# Patient Record
Sex: Female | Born: 1960 | Race: White | Hispanic: No | Marital: Married | Smoking: Never smoker
Health system: Southern US, Community
[De-identification: ages and names within clinical notes are randomized; demographics above are authoritative.]

## PROBLEM LIST (undated history)

## (undated) DIAGNOSIS — R7689 Other specified abnormal immunological findings in serum: Secondary | ICD-10-CM

## (undated) DIAGNOSIS — E7800 Pure hypercholesterolemia, unspecified: Secondary | ICD-10-CM

## (undated) DIAGNOSIS — F419 Anxiety disorder, unspecified: Secondary | ICD-10-CM

## (undated) DIAGNOSIS — M069 Rheumatoid arthritis, unspecified: Secondary | ICD-10-CM

## (undated) DIAGNOSIS — J45909 Unspecified asthma, uncomplicated: Secondary | ICD-10-CM

## (undated) DIAGNOSIS — G43909 Migraine, unspecified, not intractable, without status migrainosus: Secondary | ICD-10-CM

## (undated) DIAGNOSIS — R768 Other specified abnormal immunological findings in serum: Secondary | ICD-10-CM

## (undated) HISTORY — DX: Rheumatoid arthritis, unspecified: M06.9

## (undated) HISTORY — DX: Migraine, unspecified, not intractable, without status migrainosus: G43.909

## (undated) HISTORY — PX: ROTATOR CUFF REPAIR: SHX139

## (undated) HISTORY — PX: WISDOM TOOTH EXTRACTION: SHX21

## (undated) HISTORY — PX: ABDOMINAL HYSTERECTOMY: SHX81

## (undated) HISTORY — PX: CARPAL TUNNEL RELEASE: SHX101

## (undated) HISTORY — DX: Pure hypercholesterolemia, unspecified: E78.00

## (undated) HISTORY — DX: Anxiety disorder, unspecified: F41.9

## (undated) HISTORY — PX: DILATION AND CURETTAGE OF UTERUS: SHX78

## (undated) HISTORY — PX: HERNIA REPAIR: SHX51

---

## 2013-11-30 ENCOUNTER — Other Ambulatory Visit (HOSPITAL_COMMUNITY): Payer: Self-pay | Admitting: Obstetrics and Gynecology

## 2013-11-30 DIAGNOSIS — N838 Other noninflammatory disorders of ovary, fallopian tube and broad ligament: Secondary | ICD-10-CM

## 2013-12-01 ENCOUNTER — Ambulatory Visit (HOSPITAL_COMMUNITY)
Admission: RE | Admit: 2013-12-01 | Discharge: 2013-12-01 | Disposition: A | Discharge status: Home / Self Care | Payer: BC Managed Care – PPO | Source: Ambulatory Visit | Attending: Obstetrics and Gynecology | Admitting: Obstetrics and Gynecology

## 2013-12-04 ENCOUNTER — Ambulatory Visit (HOSPITAL_COMMUNITY)
Admission: RE | Admit: 2013-12-04 | Discharge: 2013-12-04 | Disposition: A | Discharge status: Home / Self Care | Payer: BC Managed Care – PPO | Source: Ambulatory Visit | Attending: Obstetrics and Gynecology | Admitting: Obstetrics and Gynecology

## 2013-12-04 ENCOUNTER — Encounter (HOSPITAL_COMMUNITY): Payer: Self-pay

## 2013-12-04 DIAGNOSIS — N838 Other noninflammatory disorders of ovary, fallopian tube and broad ligament: Secondary | ICD-10-CM | POA: Insufficient documentation

## 2013-12-04 HISTORY — DX: Unspecified asthma, uncomplicated: J45.909

## 2013-12-04 MED ORDER — IOHEXOL 300 MG/ML  SOLN
100.0000 mL | Freq: Once | INTRAMUSCULAR | Status: AC | PRN
  Administered 2013-12-04: 100 mL via INTRAVENOUS

## 2014-01-26 ENCOUNTER — Ambulatory Visit (INDEPENDENT_AMBULATORY_CARE_PROVIDER_SITE_OTHER): Payer: Self-pay | Admitting: General Surgery

## 2014-03-01 ENCOUNTER — Inpatient Hospital Stay (HOSPITAL_COMMUNITY)
Admission: AD | Admit: 2014-03-01 | Discharge: 2014-03-01 | Disposition: A | Discharge status: Home / Self Care | Payer: BC Managed Care – PPO | Source: Ambulatory Visit | Attending: Obstetrics and Gynecology | Admitting: Obstetrics and Gynecology

## 2014-03-01 ENCOUNTER — Encounter (HOSPITAL_COMMUNITY): Payer: Self-pay | Admitting: *Deleted

## 2014-03-01 DIAGNOSIS — N99820 Postprocedural hemorrhage and hematoma of a genitourinary system organ or structure following a genitourinary system procedure: Secondary | ICD-10-CM | POA: Diagnosis not present

## 2014-03-01 DIAGNOSIS — N939 Abnormal uterine and vaginal bleeding, unspecified: Secondary | ICD-10-CM | POA: Diagnosis present

## 2014-03-01 DIAGNOSIS — Z90710 Acquired absence of both cervix and uterus: Secondary | ICD-10-CM | POA: Insufficient documentation

## 2014-03-01 HISTORY — DX: Other specified abnormal immunological findings in serum: R76.89

## 2014-03-01 HISTORY — DX: Other specified abnormal immunological findings in serum: R76.8

## 2014-03-01 LAB — URINE MICROSCOPIC-ADD ON

## 2014-03-01 LAB — URINALYSIS, ROUTINE W REFLEX MICROSCOPIC
BILIRUBIN URINE: NEGATIVE
Glucose, UA: NEGATIVE mg/dL
Ketones, ur: NEGATIVE mg/dL
Nitrite: NEGATIVE
PH: 5.5 (ref 5.0–8.0)
Protein, ur: NEGATIVE mg/dL
SPECIFIC GRAVITY, URINE: 1.015 (ref 1.005–1.030)
UROBILINOGEN UA: 0.2 mg/dL (ref 0.0–1.0)

## 2014-03-01 NOTE — Discharge Instructions (Signed)
Hematoma °A hematoma is a collection of blood. The collection of blood can turn into a hard, painful lump under the skin. Your skin may turn blue or yellow if the hematoma is close to the surface of the skin. Most hematomas get better in a few days to weeks. Some hematomas are serious and need medical care. Hematomas can be very small or very big. °HOME CARE °· Apply ice to the injured area: °¨ Put ice in a plastic bag. °¨ Place a towel between your skin and the bag. °¨ Leave the ice on for 20 minutes, 2-3 times a day for the first 1 to 2 days. °· After the first 2 days, switch to using warm packs on the injured area. °· Raise (elevate) the injured area to lessen pain and puffiness (swelling). You may also wrap the area with an elastic bandage. Make sure the bandage is not wrapped too tight. °· If you have a painful hematoma on your leg or foot, you may use crutches for a couple days. °· Only take medicines as told by your doctor. °GET HELP RIGHT AWAY IF:  °· Your pain gets worse. °· Your pain is not controlled with medicine. °· You have a fever. °· Your puffiness gets worse. °· Your skin turns more blue or yellow. °· Your skin over the hematoma breaks or starts bleeding. °· Your hematoma is in your chest or belly (abdomen) and you are short of breath, feel weak, or have a change in consciousness. °· Your hematoma is on your scalp and you have a headache that gets worse or a change in alertness or consciousness. °MAKE SURE YOU:  °· Understand these instructions. °· Will watch your condition. °· Will get help right away if you are not doing well or get worse. °Document Released: 04/09/2004 Document Revised: 11/02/2012 Document Reviewed: 08/10/2012 °ExitCare® Patient Information ©2015 [employer]. This information is not intended to replace advice given to you by your health care provider. Make sure you discuss any questions you have with your health care provider. ° °

## 2014-03-01 NOTE — MAU Provider Note (Signed)
History     CSN: [id number]  Arrival date and time: 03/01/14 3567   First Provider Initiated Contact with Patient 03/01/14 [phone]      No chief complaint on file.  HPI  Diana Graham is a 53 y.o. G3P3 who is s/p LAVH, BSO and umbilical hernia repair on 02/20/14 at the [facility] who presents to MAU today with sudden onset vaginal bleeding at 0330. She states only a small amount of blood noted since initial gush that soaked her underwear. Patient endorses mild lower abdominal cramping. She has been taking Norco PRN for pain. She denies UTI symptoms or fever.   OB History    No data available      Past Medical History  Diagnosis Date  . Asthma   . ANA positive     Past Surgical History  Procedure Laterality Date  . Abdominal hysterectomy    . Carpal tunnel release    . Hernia repair    . Dilation and curettage of uterus    . Wisdom tooth extraction      History reviewed. No pertinent family history.  History  Substance Use Topics  . Smoking status: Never Smoker   . Smokeless tobacco: Not on file  . Alcohol Use: No    Allergies:  Allergies  Allergen Reactions  . Demerol [Meperidine]   . Morphine And Related   . Penicillins   . Sulfa Antibiotics     Prescriptions prior to admission  Medication Sig Dispense Refill Last Dose  . FLUoxetine (PROZAC) 20 MG capsule Take 20 mg by mouth daily.   02/28/2014 at Unknown time  . gabapentin (NEURONTIN) 100 MG capsule Take 100 mg by mouth 3 (three) times daily.   02/28/2014 at Unknown time  . HYDROcodone-acetaminophen (NORCO/VICODIN) 5-325 MG per tablet Take 1 tablet by mouth every 6 (six) hours as needed for moderate pain.   02/28/2014 at Unknown time  . methocarbamol (ROBAXIN) 500 MG tablet Take 500 mg by mouth every 6 (six) hours as needed for muscle spasms.   02/28/2014 at Unknown time    Review of Systems  Constitutional: Negative for fever and malaise/fatigue.  Gastrointestinal: Positive for abdominal pain.   Genitourinary: Negative for dysuria, urgency and frequency.       + vaginal bleeding   Physical Exam   Blood pressure 121/78, pulse 68, temperature 97.8 F (36.6 C), temperature source Oral, resp. rate 20, height 5\' 3"  (1.6 m), weight 143 lb (64.864 kg), SpO2 99 %.  Physical Exam  Constitutional: She is oriented to person, place, and time. She appears well-developed and well-nourished. No distress.  HENT:  Head: Normocephalic.  Cardiovascular: Normal rate.   Respiratory: Effort normal.  GI: Soft. She exhibits no distension and no mass. There is tenderness (mild). There is no rebound and no guarding.  Genitourinary: There is bleeding (scant dark brown blood noted without any active bleeding noted) in the vagina. No signs of injury (stiches intact at vaginal cuff) around the vagina. No vaginal discharge found.  Neurological: She is alert and oriented to person, place, and time.  Skin: Skin is warm and dry. No erythema.  Psychiatric: She has a normal mood and affect.   Results for orders placed or performed during the hospital encounter of 03/01/14 (from the past 24 hour(s))  Urinalysis, Routine w reflex microscopic     Status: Abnormal   Collection Time: 03/01/14  5:35 AM  Result Value Ref Range   Color, Urine YELLOW YELLOW   APPearance  CLEAR CLEAR   Specific Gravity, Urine 1.015 1.005 - 1.030   pH 5.5 5.0 - 8.0   Glucose, UA NEGATIVE NEGATIVE mg/dL   Hgb urine dipstick LARGE (A) NEGATIVE   Bilirubin Urine NEGATIVE NEGATIVE   Ketones, ur NEGATIVE NEGATIVE mg/dL   Protein, ur NEGATIVE NEGATIVE mg/dL   Urobilinogen, UA 0.2 0.0 - 1.0 mg/dL   Nitrite NEGATIVE NEGATIVE   Leukocytes, UA TRACE (A) NEGATIVE  Urine microscopic-add on     Status: None   Collection Time: 03/01/14  5:35 AM  Result Value Ref Range   Squamous Epithelial / LPF RARE RARE   WBC, UA 0-2 <3 WBC/hpf   RBC / HPF 7-10 <3 RBC/hpf   Bacteria, UA RARE RARE     MAU Course  Procedures None  MDM UA  today Discussed patient with Dr. Julien Girt. [place] for discharge with bleeding precautions. Follow-up as scheduled or sooner if symptoms worsen Assessment and Plan  A: S/P LAVH, BSO and umbilical hernia repair Vaginal bleeding  P: Discharge home Bleeding precautions discussed Patient to continue Norco PRN pain Patient advised to follow-up with Dr. Gaetano Net as scheduled for routine post operative appointment or sooner if symptoms worsen Patient may return to MAU as needed or if her condition were to change or worsen   Luvenia Redden, PA-C  03/01/2014, 6:21 AM

## 2014-03-01 NOTE — MAU Note (Signed)
Pt reports she awakened about 0330 with vaginal bleeding, states she is s/p lap total hysterectomy on 12/08. Onset of abd cramping yesterday. Pt also reports she had a umbilical hernia repair on 12/08

## 2015-02-27 ENCOUNTER — Encounter: Payer: Self-pay | Admitting: Gastroenterology

## 2015-03-17 HISTORY — PX: EPIDURAL BLOCK INJECTION: SHX1516

## 2015-03-20 ENCOUNTER — Ambulatory Visit: Payer: Self-pay | Admitting: Nurse Practitioner

## 2015-03-22 ENCOUNTER — Ambulatory Visit (INDEPENDENT_AMBULATORY_CARE_PROVIDER_SITE_OTHER): Payer: BLUE CROSS/BLUE SHIELD | Admitting: Gastroenterology

## 2015-03-22 ENCOUNTER — Encounter: Payer: Self-pay | Admitting: Gastroenterology

## 2015-03-22 ENCOUNTER — Other Ambulatory Visit: Payer: Self-pay

## 2015-03-22 VITALS — BP 119/73 | HR 73 | Temp 97.4°F | Ht 63.0 in | Wt 150.6 lb

## 2015-03-22 DIAGNOSIS — Z1211 Encounter for screening for malignant neoplasm of colon: Secondary | ICD-10-CM

## 2015-03-22 DIAGNOSIS — R1013 Epigastric pain: Secondary | ICD-10-CM | POA: Diagnosis not present

## 2015-03-22 MED ORDER — PANTOPRAZOLE SODIUM 40 MG PO TBEC: 40.0000 mg | DELAYED_RELEASE_TABLET | Freq: Every day | ORAL | Status: DC

## 2015-03-22 MED ORDER — NA SULFATE-K SULFATE-MG SULF 17.5-3.13-1.6 GM/177ML PO SOLN: 1.0000 | ORAL | Status: DC

## 2015-03-22 NOTE — Progress Notes (Signed)
Primary Care Physician:  Damien Fusi, NP Primary Gastroenterologist:  Dr. Oneida Alar   Chief Complaint  Patient presents with  . Gas  . Gastroesophageal Reflux    nausea  . set up TCS    HPI:   Diana Graham is a 55 y.o. female presenting today at the request of her PCP secondary to need for initial screening colonoscopy and concerns for GERD and dysphagia.   Abdominal bloating, belching, early satiety, worsening over past few months. Has been taking OTC Zantac BID for about a year but quit taking. Has never taken a PPI. No dysphagia. Feels like something is stuck in esophagus all the time, feels heavy. No solid food dysphagia. No weight loss, actually states weight gain. Associated nausea. Week before Christmas felt nauseated, had one episode of vomiting. Resolved on own. States she has always had issues with chronic constipation, occasional diarrhea. Takes Miralax just as needed. Doesn't feel she needs a prescriptive agent. Rare that she needs it. No prior colonoscopy. Takes Mobic and prednisone prn for RA.   Past Medical History  Diagnosis Date  . Asthma   . ANA positive   . RA (rheumatoid arthritis) ([place])   . Hypercholesterolemia   . Migraines   . Anxiety     Past Surgical History  Procedure Laterality Date  . Abdominal hysterectomy    . Carpal tunnel release    . Hernia repair    . Dilation and curettage of uterus    . Wisdom tooth extraction      Current Outpatient Prescriptions  Medication Sig Dispense Refill  . atorvastatin (LIPITOR) 20 MG tablet TAKE 1 TABLET BY ORAL ROUTE EVERY DAY  10  . FLUoxetine (PROZAC) 20 MG capsule Take 20 mg by mouth daily.    . folic acid (FOLVITE) 1 MG tablet Take 1 mg by mouth daily.  2  . gabapentin (NEURONTIN) 100 MG capsule Take 100 mg by mouth 3 (three) times daily.    . hydroxychloroquine (PLAQUENIL) 200 MG tablet Take 100 mg by mouth daily.    . methotrexate (RHEUMATREX) 2.5 MG tablet TAKE 6 TABLETS BY MOUTH WEEKLY  2  .  topiramate (TOPAMAX) 50 MG tablet TAKE 1/2 TO 1&1/2 TABLETS BY MOUTH AT BEDTIME  4  . Vitamin D, Ergocalciferol, (DRISDOL) 50000 units CAPS capsule Take 50,000 Units by mouth once a week.  2  . Na Sulfate-K Sulfate-Mg Sulf (SUPREP BOWEL PREP) SOLN Take 1 kit by mouth as directed. 1 Bottle 0  . pantoprazole (PROTONIX) 40 MG tablet Take 1 tablet (40 mg total) by mouth daily. 30 tablet 3   No current facility-administered medications for this visit.    Allergies as of 03/22/2015 - Review Complete 03/22/2015  Allergen Reaction Noted  . Codeine Nausea And Vomiting 03/22/2015  . Demerol [meperidine]  03/01/2014  . Morphine and related  12/04/2013  . Penicillins  12/04/2013  . Sulfa antibiotics  12/04/2013    Family History  Problem Relation Age of Onset  . Colon cancer Neg Hx   . Colon polyps Father     Social History   Social History  . Marital Status: Married    Spouse Name: N/A  . Number of Children: N/A  . Years of Education: N/A   Occupational History  . 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 Smoking status: Never Smoker   . Smokeless tobacco: Not on  file  . Alcohol Use: No  . Drug Use: No  . Sexual Activity: Not Currently   Other Topics Concern  . Not on file   Social History Narrative    Review of Systems: Gen: Denies any fever, chills, fatigue, weight loss, lack of appetite.  CV: Denies chest pain, heart palpitations, peripheral edema, syncope.  Resp: Denies shortness of breath at rest or with exertion. Denies wheezing or cough.  GI: see HPI  GU : chronic UTIs  MS: +RA Derm: Denies rash, itching, dry skin Psych: Denies depression, anxiety, memory loss, and confusion Heme: Denies bruising, bleeding, and enlarged lymph nodes.  Physical Exam: BP 119/73 mmHg  Pulse 73  Temp(Src) 97.4 F (36.3 C)  Ht '5\' 3"'  (1.6 m)  Wt 150 lb 9.6 oz (68.312 kg)  BMI 26.68 kg/m2 General:   Alert and oriented.  Pleasant and cooperative. Well-nourished and well-developed.  Head:  Normocephalic and atraumatic. Eyes:  Without icterus, sclera clear and conjunctiva pink.  Ears:  Normal auditory acuity. Nose:  No deformity, discharge,  or lesions. Mouth:  No deformity or lesions, oral mucosa pink.  Lungs:  Clear to auscultation bilaterally. No wheezes, rales, or rhonchi. No distress.  Heart:  S1, S2 present without murmurs appreciated.  Abdomen:  +BS, soft, non-tender and non-distended. No HSM noted. No guarding or rebound. No masses appreciated.  Rectal:  Deferred  Msk:  Symmetrical without gross deformities. Normal posture. Extremities:  Without edema. Neurologic:  Alert and  oriented x4;  grossly normal neurologically. Psych:  Alert and cooperative. Normal mood and affect.

## 2015-03-22 NOTE — Patient Instructions (Signed)
I have sent Protonix to your pharmacy to take once each morning, 30 minutes before breakfast.   We have scheduled you for a colonoscopy with possible upper endoscopy with Dr. Oneida Alar in the near future.

## 2015-03-26 NOTE — Assessment & Plan Note (Signed)
55 year old female with need for initial screening colonoscopy, without any concerning lower GI symptoms. Father with history of polyps but negative family history for colon cancer.  Proceed with colonoscopy with Dr. Oneida Alar in the near future. The risks, benefits, and alternatives have been discussed in detail with the patient. They state understanding and desire to proceed.  Phenergan 25 mg IV on call due to polypharmacy PATIENT IS ALLERGIC TO DEMEROL

## 2015-03-26 NOTE — Assessment & Plan Note (Signed)
Abdominal bloating, early satiety, belching, worsening over the past few months. No dysphagia but does note a globus sensation. Likely symptoms secondary to GERD. No true esophageal dysphagia or other concerning signs such as lack of appetite or weight loss. Will trial Protonix once daily. If no improvement by time of colonoscopy, proceed with EGD.

## 2015-03-27 NOTE — Progress Notes (Signed)
CC'ED TO PCP 

## 2015-04-12 ENCOUNTER — Ambulatory Visit (HOSPITAL_COMMUNITY)
Admission: RE | Admit: 2015-04-12 | Discharge: 2015-04-12 | Disposition: A | Discharge status: Home / Self Care | Payer: BLUE CROSS/BLUE SHIELD | Source: Ambulatory Visit | Attending: Gastroenterology | Admitting: Gastroenterology

## 2015-04-12 ENCOUNTER — Encounter (HOSPITAL_COMMUNITY): Payer: Self-pay | Admitting: *Deleted

## 2015-04-12 ENCOUNTER — Encounter (HOSPITAL_COMMUNITY)
Admission: RE | Disposition: A | Discharge status: Home / Self Care | Payer: Self-pay | Source: Ambulatory Visit | Attending: Gastroenterology

## 2015-04-12 DIAGNOSIS — K222 Esophageal obstruction: Secondary | ICD-10-CM | POA: Insufficient documentation

## 2015-04-12 DIAGNOSIS — D128 Benign neoplasm of rectum: Secondary | ICD-10-CM | POA: Diagnosis not present

## 2015-04-12 DIAGNOSIS — K648 Other hemorrhoids: Secondary | ICD-10-CM | POA: Insufficient documentation

## 2015-04-12 DIAGNOSIS — E7800 Pure hypercholesterolemia, unspecified: Secondary | ICD-10-CM | POA: Insufficient documentation

## 2015-04-12 DIAGNOSIS — Z79899 Other long term (current) drug therapy: Secondary | ICD-10-CM | POA: Diagnosis not present

## 2015-04-12 DIAGNOSIS — K5790 Diverticulosis of intestine, part unspecified, without perforation or abscess without bleeding: Secondary | ICD-10-CM | POA: Diagnosis not present

## 2015-04-12 DIAGNOSIS — Q438 Other specified congenital malformations of intestine: Secondary | ICD-10-CM | POA: Insufficient documentation

## 2015-04-12 DIAGNOSIS — Z1211 Encounter for screening for malignant neoplasm of colon: Secondary | ICD-10-CM | POA: Insufficient documentation

## 2015-04-12 DIAGNOSIS — F419 Anxiety disorder, unspecified: Secondary | ICD-10-CM | POA: Insufficient documentation

## 2015-04-12 DIAGNOSIS — Q394 Esophageal web: Secondary | ICD-10-CM

## 2015-04-12 DIAGNOSIS — R1310 Dysphagia, unspecified: Secondary | ICD-10-CM | POA: Diagnosis not present

## 2015-04-12 DIAGNOSIS — R1013 Epigastric pain: Secondary | ICD-10-CM | POA: Diagnosis not present

## 2015-04-12 DIAGNOSIS — D125 Benign neoplasm of sigmoid colon: Secondary | ICD-10-CM | POA: Insufficient documentation

## 2015-04-12 DIAGNOSIS — K2970 Gastritis, unspecified, without bleeding: Secondary | ICD-10-CM | POA: Insufficient documentation

## 2015-04-12 HISTORY — PX: ESOPHAGEAL DILATION: SHX303

## 2015-04-12 HISTORY — PX: ESOPHAGOGASTRODUODENOSCOPY: SHX5428

## 2015-04-12 HISTORY — PX: COLONOSCOPY: SHX5424

## 2015-04-12 SURGERY — COLONOSCOPY
Anesthesia: Moderate Sedation

## 2015-04-12 MED ORDER — LIDOCAINE VISCOUS 2 % MT SOLN
OROMUCOSAL | Status: DC | PRN
  Administered 2015-04-12: 4 mL via OROMUCOSAL

## 2015-04-12 MED ORDER — MIDAZOLAM HCL 5 MG/5ML IJ SOLN
INTRAMUSCULAR | Status: AC
  Filled 2015-04-12: qty 10

## 2015-04-12 MED ORDER — MIDAZOLAM HCL 5 MG/5ML IJ SOLN
INTRAMUSCULAR | Status: DC | PRN
Start: 2015-04-12 — End: 2015-04-12
  Administered 2015-04-12: 1 mg via INTRAVENOUS
  Administered 2015-04-12: 2 mg via INTRAVENOUS
  Administered 2015-04-12 (×2): 1 mg via INTRAVENOUS
  Administered 2015-04-12: 2 mg via INTRAVENOUS
  Administered 2015-04-12: 1 mg via INTRAVENOUS

## 2015-04-12 MED ORDER — PROMETHAZINE HCL 25 MG/ML IJ SOLN
25.0000 mg | Freq: Once | INTRAMUSCULAR | Status: AC
  Administered 2015-04-12: 25 mg via INTRAVENOUS

## 2015-04-12 MED ORDER — SODIUM CHLORIDE 0.9 % IV SOLN
INTRAVENOUS | Status: DC
  Administered 2015-04-12: 1000 mL via INTRAVENOUS

## 2015-04-12 MED ORDER — FENTANYL CITRATE (PF) 100 MCG/2ML IJ SOLN
INTRAMUSCULAR | Status: DC
Start: 2015-04-12 — End: 2015-04-12
  Filled 2015-04-12: qty 4

## 2015-04-12 MED ORDER — PROMETHAZINE HCL 25 MG/ML IJ SOLN
INTRAMUSCULAR | Status: AC
  Filled 2015-04-12: qty 1

## 2015-04-12 MED ORDER — FENTANYL CITRATE (PF) 100 MCG/2ML IJ SOLN
INTRAMUSCULAR | Status: DC | PRN
  Administered 2015-04-12 (×3): 25 ug via INTRAVENOUS
  Administered 2015-04-12: 50 ug via INTRAVENOUS
  Administered 2015-04-12: 25 ug via INTRAVENOUS

## 2015-04-12 MED ORDER — SIMETHICONE 40 MG/0.6ML PO SUSP
ORAL | Status: DC | PRN
  Administered 2015-04-12: 14:00:00

## 2015-04-12 MED ORDER — MEPERIDINE HCL 100 MG/ML IJ SOLN
INTRAMUSCULAR | Status: AC
  Filled 2015-04-12: qty 2

## 2015-04-12 MED ORDER — MINERAL OIL PO OIL
TOPICAL_OIL | ORAL | Status: AC
  Filled 2015-04-12: qty 30

## 2015-04-12 MED ORDER — LIDOCAINE VISCOUS 2 % MT SOLN
OROMUCOSAL | Status: AC
  Filled 2015-04-12: qty 15

## 2015-04-12 MED ORDER — SODIUM CHLORIDE 0.9% FLUSH
INTRAVENOUS | Status: AC
  Filled 2015-04-12: qty 10

## 2015-04-12 NOTE — Progress Notes (Signed)
REVIEWED-NO ADDITIONAL RECOMMENDATIONS. 

## 2015-04-12 NOTE — Op Note (Signed)
[facility] [address] [place], [address]   COLONOSCOPY PROCEDURE REPORT  PATIENT: Diana Graham, Diana Graham  MR#: [id number] BIRTHDATE: [birth year] , 60  yrs. old GENDER: female ENDOSCOPIST: Danie Binder, MD REFERRED VS:[id number] [place], [place]:  04/21/15 PROCEDURE:   Colonoscopy with snare polypectomy INDICATIONS:average risk patient for colon cancer. MEDICATIONS: Fentanyl 125 mcg IV, Versed 6 mg IV, and Promethazine (Phenergan) 25 mg IV  DESCRIPTION OF PROCEDURE:    Physical exam was performed.  Informed consent was obtained from the patient after explaining the benefits, risks, and alternatives to procedure.  The patient was connected to monitor and placed in left lateral position. Continuous oxygen was provided by nasal cannula and IV medicine administered through an indwelling cannula.  After administration of sedation and rectal exam, the patients rectum was intubated and the [id number]  colonoscope was advanced under direct visualization to the cecum.  The scope was removed slowly by carefully examining the color, texture, anatomy, and integrity mucosa on the way out.  The patient was recovered in endoscopy and discharged home in satisfactory condition. Estimated blood loss is zero unless otherwise noted in this procedure report.    COLON FINDINGS: The colon was redundant.  Manual abdominal counter-pressure was used to reach the cecum, Two polyps ranging from 6 to 45mm in size were found in the rectum and sigmoid colon(1.2 CM).  A polypectomy was performed using snare cautery. Bleeding at the site was controlled using hemoclips.  One (1) placement was made IN THE SIGMOID COLON.  There was minimal blood loss from maneuver subsiding by end of procedure, There was moderate diverticulosis noted throughout the entire examined colon with associated muscular hypertrophy, tortuosity and angulation.  , and Small internal hemorrhoids were  found.  PREP QUALITY: good.  CECAL W/D TIME: 20       minutes COMPLICATIONS: None  ENDOSCOPIC IMPRESSION: 1.   The LEFT colon IS redundant 2.   Two COLON polyps REMOVED r 3.   Moderate diverticulosis throughout the entire examined colon 4.   Small internal hemorrhoids  RECOMMENDATIONS: NO MRI FOR 30 DAYS. DRINK WATER TO KEEP URINE LIGHT YELLOW. AVOID ASPIRIN, IBUPROFEN, MOTRIN, ALEVE, BC/GOODY POWDERS FOR 2 WEEKS.  USE TYLENOL FOR PAIN. FOLLOW A HIGH FIBER/LOW FAT DIET. AVOID THINGS THAT TRIGGERS REFLUX. AWAIT BIOPSY RESULTS. FOLLOW UP IN 3 MOS. Next colonoscopy in 1-3 years WITH [facility].  ALL SISTERS, BROTHERS, CHILDREN, AND PARENTS NEED TO HAVE A COLONOSCOPY STARTING AT THE AGE OF 40.  eSigned:  Danie Binder, MD 04-21-15 4:07 PM  CPT CODES: ICD CODES:  The ICD and CPT codes recommended by this software are interpretations from the data that the clinical staff has captured with the software.  The verification of the translation of this report to the ICD and CPT codes and modifiers is the sole responsibility of the health care institution and practicing physician where this report was generated.  [employer] will not be held responsible for the validity of the ICD and CPT codes included on this report.  AMA assumes no liability for data contained or not contained herein. CPT is a Designer, television/film set of the [employer].

## 2015-04-12 NOTE — Discharge Instructions (Signed)
YOUR DIFFICULTY SWALLOWING/GLOBUS SENSATION IS MOST LIKELY DUE TO UNCONTROLLED GERD. I dilated your esophagus DUE TO A RING AT THE BASE. You have MILD gastritis. I biopsied your ESOPHAGUS AND stomach. You had 2 polyps removed. I PLACED A CLIP TO PREVENT BLEEDING IN 7-10 DAYS. You have internal hemorrhoids and diverticulosis IN YOUR ENTIRE COLON.   DRINK WATER TO KEEP YOUR URINE LIGHT YELLOW.  AVOID ASPIRIN, IBUPROFEN, MOTRIN, ALEVE, BC/GOODY POWDERS FOR 2 WEEKS. USE TYLENOL FOR PAIN.  FOLLOW A HIGH FIBER/LOW FAT DIET. AVOID ITEMS THAT CAUSE BLOATING. SEE INFO BELOW.  AVOID THINGS THAT TRIGGERS REFLUX. SEE INFO BELOW.   YOUR BIOPSY RESULTS WILL BE AVAILABLE IN MY CHART JAN 31 AND MY OFFICE WILL CONTACT YOU IN 10-14 DAYS WITH YOUR RESULTS.   FOLLOW UP IN 3 MOS.  Next colonoscopy in 1-3 years. YOUR SISTERS, BROTHERS, CHILDREN, AND PARENTS NEED TO HAVE A COLONOSCOPY STARTING AT THE AGE OF 40.    ENDOSCOPY Care After Read the instructions outlined below and refer to this sheet in the next week. These discharge instructions provide you with general information on caring for yourself after you leave the hospital. While your treatment has been planned according to the most current medical practices available, unavoidable complications occasionally occur. If you have any problems or questions after discharge, call DR. Lajuanna Pompa, [phone].  ACTIVITY  You may resume your regular activity, but move at a slower pace for the next 24 hours.   Take frequent rest periods for the next 24 hours.   Walking will help get rid of the air and reduce the bloated feeling in your belly (abdomen).   No driving for 24 hours (because of the medicine (anesthesia) used during the test).   You may shower.   Do not sign any important legal documents or operate any machinery for 24 hours (because of the anesthesia used during the test).    NUTRITION  Drink plenty of fluids.   You may resume your normal diet  as instructed by your doctor.   Begin with a light meal and progress to your normal diet. Heavy or fried foods are harder to digest and may make you feel sick to your stomach (nauseated).   Avoid alcoholic beverages for 24 hours or as instructed.    MEDICATIONS  You may resume your normal medications.   WHAT YOU CAN EXPECT TODAY  Some feelings of bloating in the abdomen.   Passage of more gas than usual.   Spotting of blood in your stool or on the toilet paper  .  IF YOU HAD POLYPS REMOVED DURING THE ENDOSCOPY:  Eat a soft diet IF YOU HAVE NAUSEA, BLOATING, ABDOMINAL PAIN, OR VOMITING.    FINDING OUT THE RESULTS OF YOUR TEST Not all test results are available during your visit. DR. Oneida Alar WILL CALL YOU WITHIN 14 DAYS OF YOUR PROCEDUE WITH YOUR RESULTS. Do not assume everything is normal if you have not heard from DR. Samuele Storey, CALL HER OFFICE AT [phone].  SEEK IMMEDIATE MEDICAL ATTENTION AND CALL THE OFFICE: [phone] IF:  You have more than a spotting of blood in your stool.   Your belly is swollen (abdominal distention).   You are nauseated or vomiting.   You have a temperature over 101F.   You have abdominal pain or discomfort that is severe or gets worse throughout the day. Gastritis  Gastritis is an inflammation (the body's way of reacting to injury and/or infection) of the stomach. It is often caused by viral  or bacterial (germ) infections. It can also be caused BY ASPIRIN, BC/GOODY POWDER'S, (IBUPROFEN) MOTRIN, OR ALEVE (NAPROXEN), chemicals (including alcohol), SPICY FOODS, and medications. This illness may be associated with generalized malaise (feeling tired, not well), UPPER ABDOMINAL STOMACH cramps, and fever. One common bacterial cause of gastritis is an organism known as H. Pylori. This can be treated with antibiotics.   High-Fiber Diet A high-fiber diet changes your normal diet to include more whole grains, legumes, fruits, and vegetables. Changes in  the diet involve replacing refined carbohydrates with unrefined foods. The calorie level of the diet is essentially unchanged. The Dietary Reference Intake (recommended amount) for adult males is 38 grams per day. For adult females, it is 25 grams per day. Pregnant and lactating women should consume 28 grams of fiber per day. Fiber is the intact part of a plant that is not broken down during digestion. Functional fiber is fiber that has been isolated from the plant to provide a beneficial effect in the body. PURPOSE  Increase stool bulk.   Ease and regulate bowel movements.   Lower cholesterol.  REDUCE RISK OF COLON CANCER  INDICATIONS THAT YOU NEED MORE FIBER  Constipation and hemorrhoids.   Uncomplicated diverticulosis (intestine condition) and irritable bowel syndrome.   Weight management.   As a protective measure against hardening of the arteries (atherosclerosis), diabetes, and cancer.   GUIDELINES FOR INCREASING FIBER IN THE DIET  Start adding fiber to the diet slowly. A gradual increase of about 5 more grams (2 slices of whole-wheat bread, 2 servings of most fruits or vegetables, or 1 bowl of high-fiber cereal) per day is best. Too rapid an increase in fiber may result in constipation, flatulence, and bloating.   Drink enough water and fluids to keep your urine clear or pale yellow. Water, juice, or caffeine-free drinks are recommended. Not drinking enough fluid may cause constipation.   Eat a variety of high-fiber foods rather than one type of fiber.   Try to increase your intake of fiber through using high-fiber foods rather than fiber pills or supplements that contain small amounts of fiber.   The goal is to change the types of food eaten. Do not supplement your present diet with high-fiber foods, but replace foods in your present diet.   INCLUDE A VARIETY OF FIBER SOURCES  Replace refined and processed grains with whole grains, canned fruits with fresh fruits, and  incorporate other fiber sources. White rice, white breads, and most bakery goods contain little or no fiber.   Brown whole-grain rice, buckwheat oats, and many fruits and vegetables are all good sources of fiber. These include: broccoli, Brussels sprouts, cabbage, cauliflower, beets, sweet potatoes, white potatoes (skin on), carrots, tomatoes, eggplant, squash, berries, fresh fruits, and dried fruits.   Cereals appear to be the richest source of fiber. Cereal fiber is found in whole grains and bran. Bran is the fiber-rich outer coat of cereal grain, which is largely removed in refining. In whole-grain cereals, the bran remains. In breakfast cereals, the largest amount of fiber is found in those with "bran" in their names. The fiber content is sometimes indicated on the label.   You may need to include additional fruits and vegetables each day.   In baking, for 1 cup white flour, you may use the following substitutions:   1 cup whole-wheat flour minus 2 tablespoons.   1/2 cup white flour plus 1/2 cup whole-wheat flour.    Low-Fat Diet BREADS, CEREALS, PASTA, RICE, DRIED PEAS, AND  BEANS These products are high in carbohydrates and most are low in fat. Therefore, they can be increased in the diet as substitutes for fatty foods. They too, however, contain calories and should not be eaten in excess. Cereals can be eaten for snacks as well as for breakfast.  Include foods that contain fiber (fruits, vegetables, whole grains, and legumes). Research shows that fiber may lower blood cholesterol levels, especially the water-soluble fiber found in fruits, vegetables, oat products, and legumes. FRUITS AND VEGETABLES It is good to eat fruits and vegetables. Besides being sources of fiber, both are rich in vitamins and some minerals. They help you get the daily allowances of these nutrients. Fruits and vegetables can be used for snacks and desserts. MEATS Limit lean meat, chicken, [place], and fish to no more  than 6 ounces per day. Beef, Pork, and Lamb Use lean cuts of beef, pork, and lamb. Lean cuts include:  Extra-lean ground beef.  Arm roast.  Sirloin tip.  Center-cut ham.  Round steak.  Loin chops.  Rump roast.  Tenderloin.  Trim all fat off the outside of meats before cooking. It is not necessary to severely decrease the intake of red meat, but lean choices should be made. Lean meat is rich in protein and contains a highly absorbable form of iron. Premenopausal women, in particular, should avoid reducing lean red meat because this could increase the risk for low red blood cells (iron-deficiency anemia).  Chicken and [place] These are good sources of protein. The fat of poultry can be reduced by removing the skin and underlying fat layers before cooking. Chicken and [place] can be substituted for lean red meat in the diet. Poultry should not be fried or covered with high-fat sauces. Fish and Shellfish Fish is a good source of protein. Shellfish contain cholesterol, but they usually are low in saturated fatty acids. The preparation of fish is important. Like chicken and [place], they should not be fried or covered with high-fat sauces. EGGS Egg whites contain no fat or cholesterol. They can be eaten often. Try 1 to 2 egg whites instead of whole eggs in recipes or use egg substitutes that do not contain yolk.  MILK AND DAIRY PRODUCTS Use skim or 1% milk instead of 2% or whole milk. Decrease whole milk, natural, and processed cheeses. Use nonfat or low-fat (2%) cottage cheese or low-fat cheeses made from vegetable oils. Choose nonfat or low-fat (1 to 2%) yogurt. Experiment with evaporated skim milk in recipes that call for heavy cream. Substitute low-fat yogurt or low-fat cottage cheese for sour cream in dips and salad dressings. Have at least 2 servings of low-fat dairy products, such as 2 glasses of skim (or 1%) milk each day to help get your daily calcium intake.  FATS AND OILS Butterfat, lard,  and beef fats are high in saturated fat and cholesterol. These should be avoided.Vegetable fats do not contain cholesterol. AVOID coconut oil, palm oil, and palm kernel oil, WHICH are very high in saturated fats. These should be limited. These fats are often used in bakery goods, processed foods, popcorn, oils, and nondairy creamers. Vegetable shortenings and some peanut butters contain hydrogenated oils, which are also saturated fats. Read the labels on these foods and check for saturated vegetable oils.  Desirable liquid vegetable oils are corn oil, cottonseed oil, olive oil, canola oil, safflower oil, soybean oil, and sunflower oil. Peanut oil is not as good, but small amounts are acceptable. Buy a heart-healthy tub margarine that has no  partially hydrogenated oils in the ingredients. AVOID Mayonnaise and salad dressings often are made from unsaturated fats.  OTHER EATING TIPS Snacks  Most sweets should be limited as snacks. They tend to be rich in calories and fats, and their caloric content outweighs their nutritional value. Some good choices in snacks are graham crackers, melba toast, soda crackers, bagels (no egg), English muffins, fruits, and vegetables. These snacks are preferable to snack crackers, [place] fries, and chips. Popcorn should be air-popped or cooked in small amounts of liquid vegetable oil.  Desserts Eat fruit, low-fat yogurt, and fruit ices instead of pastries, cake, and cookies. Sherbet, angel food cake, gelatin dessert, frozen low-fat yogurt, or other frozen products that do not contain saturated fat (pure fruit juice bars, frozen ice pops) are also acceptable.   COOKING METHODS Choose those methods that use little or no fat. They include: Poaching.  Braising.  Steaming.  Grilling.  Baking.  Stir-frying.  Broiling.  Microwaving.  Foods can be cooked in a nonstick pan without added fat, or use a nonfat cooking spray in regular cookware. Limit fried foods and avoid frying  in saturated fat. Add moisture to lean meats by using water, broth, cooking wines, and other nonfat or low-fat sauces along with the cooking methods mentioned above. Soups and stews should be chilled after cooking. The fat that forms on top after a few hours in the refrigerator should be skimmed off. When preparing meals, avoid using excess salt. Salt can contribute to raising blood pressure in some people.  EATING AWAY FROM HOME Order entres, potatoes, and vegetables without sauces or butter. When meat exceeds the size of a deck of cards (3 to 4 ounces), the rest can be taken home for another meal. Choose vegetable or fruit salads and ask for low-calorie salad dressings to be served on the side. Use dressings sparingly. Limit high-fat toppings, such as bacon, crumbled eggs, cheese, sunflower seeds, and olives. Ask for heart-healthy tub margarine instead of butter.   Polyps, Colon  A polyp is extra tissue that grows inside your body. Colon polyps grow in the large intestine. The large intestine, also called the colon, is part of your digestive system. It is a long, hollow tube at the end of your digestive tract where your body makes and stores stool. Most polyps are not dangerous. They are benign. This means they are not cancerous. But over time, some types of polyps can turn into cancer. Polyps that are smaller than a pea are usually not harmful. But larger polyps could someday become or may already be cancerous. To be safe, doctors remove all polyps and test them.   PREVENTION There is not one sure way to prevent polyps. You might be able to lower your risk of getting them if you:  Eat more fruits and vegetables and less fatty food.   Do not smoke.   Avoid alcohol.   Exercise every day.   Lose weight if you are overweight.   Eating more calcium and folate can also lower your risk of getting polyps. Some foods that are rich in calcium are milk, cheese, and broccoli. Some foods that are  rich in folate are chickpeas, kidney beans, and spinach.   Diverticulosis Diverticulosis is a common condition that develops when small pouches (diverticula) form in the wall of the colon. The risk of diverticulosis increases with age. It happens more often in people who eat a low-fiber diet. Most individuals with diverticulosis have no symptoms. Those individuals with symptoms  usually experience belly (abdominal) pain, constipation, or loose stools (diarrhea).  HOME CARE INSTRUCTIONS  Increase the amount of fiber in your diet as directed by your caregiver or dietician. This may reduce symptoms of diverticulosis.   Drink at least 6 to 8 glasses of water each day to prevent constipation.   Try not to strain when you have a bowel movement.   Avoiding nuts and seeds to prevent complications is NOT NECESSARY.   FOODS HAVING HIGH FIBER CONTENT INCLUDE:  Fruits. Apple, peach, pear, tangerine, raisins, prunes.   Vegetables. Brussels sprouts, asparagus, broccoli, cabbage, carrot, cauliflower, romaine lettuce, spinach, summer squash, tomato, winter squash, zucchini.   Starchy Vegetables. Baked beans, kidney beans, lima beans, split peas, lentils, potatoes (with skin).   Grains. Whole wheat bread, brown rice, bran flake cereal, plain oatmeal, white rice, shredded wheat, bran muffins.   SEEK IMMEDIATE MEDICAL CARE IF:  You develop increasing pain or severe bloating.   You have an oral temperature above 101F.   You develop vomiting or bowel movements that are bloody or black.   Hemorrhoids Hemorrhoids are dilated (enlarged) veins around the rectum. Sometimes clots will form in the veins. This makes them swollen and painful. These are called thrombosed hemorrhoids. Causes of hemorrhoids include:  Constipation.   Straining to have a bowel movement.   HEAVY LIFTING  HOME CARE INSTRUCTIONS  Eat a well balanced diet and drink 6 to 8 glasses of water every day to avoid constipation. You  may also use a bulk laxative.   Avoid straining to have bowel movements.   Keep anal area dry and clean.   Do not use a donut shaped pillow or sit on the toilet for long periods. This increases blood pooling and pain.   Move your bowels when your body has the urge; this will require less straining and will decrease pain and pressure.

## 2015-04-12 NOTE — Op Note (Signed)
[facility] [address] [place], [address]   ENDOSCOPY PROCEDURE REPORT  PATIENT: Diana Graham, Diana Graham  MR#: [id number] BIRTHDATE: [birth year] , 51  yrs. old GENDER: female  ENDOSCOPIST: Danie Binder, MD REFFERED [id number] [place], [place]:  2015-04-30 PROCEDURE:   EGD with biopsy and EGD with dilatation over guidewire   INDICATIONS:1.  dysphagia.   2.  dyspepsia. MEDICATIONS: TCS+ Versed 4 mg IV and Fentanyl 25 mcg IV TOPICAL ANESTHETIC: Viscous Xylocaine  DESCRIPTION OF PROCEDURE:   After the risks benefits and alternatives of the procedure were thoroughly explained, informed consent was obtained.  The [id number]  endoscope was introduced through the mouth and advanced to the second portion of the duodenum. The instrument was slowly withdrawn as the mucosa was carefully examined.  Prior to withdrawal of the scope, the guidwire was placed.  The esophagus was dilated successfully.  The patient was recovered in endoscopy and discharged home in satisfactory condition. Estimated blood loss is zero unless otherwise noted in this procedure report.   ESOPHAGUS: A Schatzki ring was found at the gastroesophageal junction and was widely open.  Multiple biopsies were taken in the distal and mid esophagus to rule out eosinophilic esophagitis. STOMACH: Mild non-erosive gastritis (inflammation) was found in the gastric antrum.  Multiple biopsies were performed.   DUODENUM: The duodenal mucosa showed no abnormalities in the bulb and second portion of the duodenum.   Dilation was then performed at the gastroesphageal junction Dilator: Savary over guidewire Size(s): 16-17 MM Resistance: minimal Heme: yes TRACE  COMPLICATIONS: .  PT AGITATED FOR ENDOSCOPY IN SPITE OF ADEQUATE SEDATION.  NO RECALL IN POSTOP.  ENDOSCOPIC IMPRESSION: 1.  Schatzki ring at the gastroesophageal junction 2.  MILD Non-erosive gastritis 3.  DYSPEPSIA/DYSPHAGIA MOST LIKELY DUE TO  GERD, LESS LIKELY EOE OR ESOPHAGEAL MOTILITY DISOREDER  RECOMMENDATIONS: NO MRI FOR 30 DAYS. DRINK WATER TO KEEP URINE LIGHT YELLOW. AVOID ASPIRIN, IBUPROFEN, MOTRIN, ALEVE, BC/GOODY POWDERS FOR 2 WEEKS.  USE TYLENOL FOR PAIN. FOLLOW A HIGH FIBER/LOW FAT DIET. AVOID THINGS THAT TRIGGERS REFLUX. AWAIT BIOPSY RESULTS. FOLLOW UP IN 3 MOS. Next colonoscopy in 1-3 years WITH MAC.  ALL SISTERS, BROTHERS, CHILDREN, AND PARENTS NEED TO HAVE A COLONOSCOPY STARTING AT THE AGE OF 40. NEXT EGD WITH MAC  eSigned:  Danie Binder, MD 2015/04/30 4:14 PM  CPT CODES: ICD CODES:  The ICD and CPT codes recommended by this software are interpretations from the data that the clinical staff has captured with the software.  The verification of the translation of this report to the ICD and CPT codes and modifiers is the sole responsibility of the health care institution and practicing physician where this report was generated.  [employer] will not be held responsible for the validity of the ICD and CPT codes included on this report.  AMA assumes no liability for data contained or not contained herein. CPT is a Designer, television/film set of the [employer].

## 2015-04-12 NOTE — H&P (Signed)
Primary Care Physician:  Damien Fusi, NP Primary Gastroenterologist:  Dr. Oneida Alar  Pre-Procedure History & Physical: HPI:  Diana Graham is a 55 y.o. female here for DYSPHAGIA/screening.  Past Medical History  Diagnosis Date  . Asthma   . ANA positive   . RA (rheumatoid arthritis) ([place])   . Hypercholesterolemia   . Migraines   . Anxiety     Past Surgical History  Procedure Laterality Date  . Abdominal hysterectomy    . Carpal tunnel release    . Hernia repair    . Dilation and curettage of uterus    . Wisdom tooth extraction      Prior to Admission medications   Medication Sig Start Date End Date Taking? Authorizing Provider  atorvastatin (LIPITOR) 20 MG tablet TAKE 1 TABLET BY ORAL ROUTE EVERY DAY 03/12/15  Yes Historical Provider, MD  FLUoxetine (PROZAC) 20 MG capsule Take 20 mg by mouth daily.   Yes Historical Provider, MD  folic acid (FOLVITE) 1 MG tablet Take 1 mg by mouth daily. 03/02/15  Yes Historical Provider, MD  gabapentin (NEURONTIN) 100 MG capsule Take 100 mg by mouth at bedtime.    Yes Historical Provider, MD  hydroxychloroquine (PLAQUENIL) 200 MG tablet Take 100 mg by mouth daily.   Yes Historical Provider, MD  methotrexate (RHEUMATREX) 2.5 MG tablet TAKE 6 TABLETS BY MOUTH WEEKLY ON SATURDAYS. 03/14/15  Yes Historical Provider, MD  Na Sulfate-K Sulfate-Mg Sulf (SUPREP BOWEL PREP) SOLN Take 1 kit by mouth as directed. 03/22/15  Yes Danie Binder, MD  pantoprazole (PROTONIX) 40 MG tablet Take 1 tablet (40 mg total) by mouth daily. 03/22/15  Yes Orvil Feil, NP  topiramate (TOPAMAX) 50 MG tablet Take 1 tablet by mouth at bedtime. 03/09/15  Yes Historical Provider, MD  Vitamin D, Ergocalciferol, (DRISDOL) 50000 units CAPS capsule Take 50,000 Units by mouth once a week. Takes on Saturdays. 03/02/15  Yes Historical Provider, MD    Allergies as of 03/22/2015 - Review Complete 03/22/2015  Allergen Reaction Noted  . Codeine Nausea And Vomiting 03/22/2015  . Demerol  [meperidine]  03/01/2014  . Morphine and related  12/04/2013  . Penicillins  12/04/2013  . Sulfa antibiotics  12/04/2013    Family History  Problem Relation Age of Onset  . Colon cancer Neg Hx   . Colon polyps Father   . Arthritis/Rheumatoid Father   . Atrial fibrillation Father   . Arthritis/Rheumatoid Mother     Social History   Social History  . Marital Status: Married    Spouse Name: N/A  . Number of Children: N/A  . Years of Education: N/A   Occupational History  . [employer] in Cardiac Cath lab    Social History Main Topics  . Smoking status: Never Smoker   . Smokeless tobacco: Not on file  . Alcohol Use: No  . Drug Use: No  . Sexual Activity: Not Currently   Other Topics Concern  . Not on file   Social History Narrative    Review of Systems: See HPI, otherwise negative ROS   Physical Exam: BP 114/73 mmHg  Pulse 63  Temp(Src) 98 F (36.7 C) (Oral)  Resp 15  Ht _0  (1.6 m)  Wt 149 lb (67.586 kg)  BMI 26.40 kg/m2  SpO2 99% General:   Alert,  pleasant and cooperative in NAD Head:  Normocephalic and atraumatic. Neck:  Supple; Lungs:  Clear throughout to auscultation.  Heart:  Regular rate and rhythm. Abdomen:  Soft, nontender and nondistended. Normal bowel sounds, without guarding, and without rebound.   Neurologic:  Alert and  oriented x4;  grossly normal neurologically.  Impression/Plan:     DYSPHAGIA/screening  PLAN:  EGD/possible DIL/tcs TODAY

## 2015-04-15 ENCOUNTER — Encounter: Payer: Self-pay | Admitting: Gastroenterology

## 2015-04-17 ENCOUNTER — Encounter (HOSPITAL_COMMUNITY): Payer: Self-pay | Admitting: Gastroenterology

## 2015-04-29 ENCOUNTER — Telehealth: Payer: Self-pay | Admitting: Gastroenterology

## 2015-04-29 NOTE — Telephone Encounter (Signed)
Please call pt. She had TWO simple adenomas removed. SHE DES NOT HAVE H PYLORI GASTRITIS. HER ESOPHAGEAL BIOPSIES ARE NORMAL.   DRINK WATER TO KEEP YOUR URINE LIGHT YELLOW.  AVOID ASPIRIN, IBUPROFEN, MOTRIN, ALEVE, BC/GOODY POWDERS FOR 2 WEEKS. USE TYLENOL FOR PAIN.  FOLLOW A HIGH FIBER/LOW FAT DIET. AVOID ITEMS THAT CAUSE BLOATING. SEE INFO BELOW.  AVOID THINGS THAT TRIGGERS REFLUX.    FOLLOW UP IN 3 MOS E30 DYSPHAGIA/EARLY SATIETY.  Next colonoscopy in 3 years. YOUR SISTERS, BROTHERS, CHILDREN, AND PARENTS NEED TO HAVE A COLONOSCOPY STARTING AT THE AGE OF 40.

## 2015-04-30 NOTE — Telephone Encounter (Signed)
Pt is aware of results. 

## 2015-04-30 NOTE — Telephone Encounter (Signed)
Reminder in epic °

## 2015-07-16 ENCOUNTER — Encounter: Payer: Self-pay | Admitting: Gastroenterology

## 2015-07-16 ENCOUNTER — Ambulatory Visit (INDEPENDENT_AMBULATORY_CARE_PROVIDER_SITE_OTHER): Payer: BLUE CROSS/BLUE SHIELD | Admitting: Gastroenterology

## 2015-07-16 VITALS — BP 126/75 | HR 98 | Temp 97.8°F | Ht 63.0 in | Wt 142.2 lb

## 2015-07-16 DIAGNOSIS — K219 Gastro-esophageal reflux disease without esophagitis: Secondary | ICD-10-CM | POA: Insufficient documentation

## 2015-07-16 DIAGNOSIS — K5900 Constipation, unspecified: Secondary | ICD-10-CM | POA: Diagnosis not present

## 2015-07-16 NOTE — Assessment & Plan Note (Signed)
Dysphagia and globus sensation resolved, s/p Schatzki's ring dilation. Doing quite well on Protonix once daily. Continue and return in 6 months.

## 2015-07-16 NOTE — Progress Notes (Signed)
Primary Care Physician:  Damien Fusi, NP  Primary GI: Dr. Oneida Alar   Chief Complaint  Patient presents with  . Follow-up    HPI:   Diana Graham is a 55 y.o. female presenting today with a history of GERD and dysphagia. EGD completed with Schatzki's ring s/p dilation, non-erosive gastritis. Colonoscopy with adenomas and needs surveillance in 2020 with Propofol.   Dysphagia and globus sensation resolved. PPI much improved symptoms. Forgets to take Miralax. Never feels very productive. Has to take Miralax every single day. Interested in a pill for constipation.   Past Medical History  Diagnosis Date  . Asthma   . ANA positive   . RA (rheumatoid arthritis) ([place])   . Hypercholesterolemia   . Migraines   . Anxiety     Past Surgical History  Procedure Laterality Date  . Abdominal hysterectomy    . Carpal tunnel release    . Hernia repair    . Dilation and curettage of uterus    . Wisdom tooth extraction    . Colonoscopy N/A 04/12/2015    Dr. Oneida Alar: 2 polyps ranging 6 to 12 mm in size in rectum and sigmoid colon. small internal hemorrhoids, adenomas. Surveillance in 2020. NEXT WITH PROPOFOL.   Marland Kitchen Esophagogastroduodenoscopy N/A 04/12/2015    Dr. 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 NEXT WITH PROPOFOL.   Marland Kitchen Esophageal dilation  04/12/2015    Procedure: ESOPHAGEAL DILATION;  Surgeon: Danie Binder, MD;  Location: [facility];  Service: Endoscopy;;    Current Outpatient Prescriptions  Medication Sig Dispense Refill  . atorvastatin (LIPITOR) 20 MG tablet TAKE 1 TABLET BY ORAL ROUTE EVERY DAY  10  . FLUoxetine (PROZAC) 20 MG capsule Take 20 mg by mouth daily.    . folic acid (FOLVITE) 1 MG tablet Take 1 mg by mouth daily.  2  . gabapentin (NEURONTIN) 100 MG capsule Take 100 mg by mouth at bedtime.     . hydroxychloroquine (PLAQUENIL) 200 MG tablet Take 100 mg by mouth daily.    . methotrexate (RHEUMATREX) 2.5 MG tablet  TAKE 6 TABLETS BY MOUTH WEEKLY ON SATURDAYS.  2  . pantoprazole (PROTONIX) 40 MG tablet Take 1 tablet (40 mg total) by mouth daily. 30 tablet 3  . topiramate (TOPAMAX) 50 MG tablet Take 1 tablet by mouth at bedtime.  4  . Vitamin D, Ergocalciferol, (DRISDOL) 50000 units CAPS capsule Take 50,000 Units by mouth once a week. Takes on Saturdays.  2   No current facility-administered medications for this visit.    Allergies as of 07/16/2015 - Review Complete 07/16/2015  Allergen Reaction Noted  . Codeine Nausea And Vomiting 03/22/2015  . Demerol [meperidine]  03/01/2014  . Morphine and related  12/04/2013  . Penicillins  12/04/2013  . Sulfa antibiotics  12/04/2013  . Ciprofloxacin Rash 04/12/2015    Family History  Problem Relation Age of Onset  . Colon cancer Neg Hx   . Colon polyps Father   . Arthritis/Rheumatoid Father   . Atrial fibrillation Father   . Arthritis/Rheumatoid Mother     Social History   Social History  . Marital Status: Married    Spouse Name: N/A  . Number of Children: N/A  . Years of Education: N/A   Occupational History  . 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 Smoking status: Never Smoker   .  Smokeless tobacco: None  . Alcohol Use: No  . Drug Use: No  . Sexual Activity: Not Currently   Other Topics Concern  . None   Social History Narrative    Review of Systems: Negative unless mentioned in HPI.   Physical Exam: BP 126/75 mmHg  Pulse 98  Temp(Src) 97.8 F (36.6 C) (Oral)  Ht 5\' 3"  (1.6 m)  Wt 142 lb 3.2 oz (64.501 kg)  BMI 25.20 kg/m2 General:   Alert and oriented. No distress noted. Pleasant and cooperative.  Head:  Normocephalic and atraumatic. Eyes:  Conjuctiva clear without scleral icterus. Abdomen:  +BS, soft, non-tender and non-distended. No rebound or guarding. No HSM or masses noted. Msk:  Symmetrical without gross deformities. Normal posture. Neurologic:  Alert  and  oriented x4;  grossly normal neurologically. Psych:  Alert and cooperative. Normal mood and affect.

## 2015-07-16 NOTE — Progress Notes (Signed)
CC'ED TO PCP 

## 2015-07-16 NOTE — Patient Instructions (Signed)
Continue Protonix.  I would like for you to try Linzess 72 mcg once each morning, at least 30 minutes before breakfast. This is for constipation. You may have some loose stool the first few days, but it should get better. Let us know if you would like a prescription for this.  Next colonoscopy in 2020.   We will see you in 6 months!

## 2015-07-16 NOTE — Assessment & Plan Note (Signed)
Miralax daily without ideal results. Start Linzess 72 mcg once daily. Samples and copay card provided. Patient to call if she would like to continue this. 6 month return. Next colonoscopy with Propofol in 2020.

## 2015-09-02 ENCOUNTER — Other Ambulatory Visit: Payer: Self-pay

## 2015-09-02 MED ORDER — PANTOPRAZOLE SODIUM 40 MG PO TBEC: 40.0000 mg | DELAYED_RELEASE_TABLET | Freq: Every day | ORAL | Status: DC

## 2015-11-14 ENCOUNTER — Encounter: Payer: Self-pay | Admitting: Gastroenterology

## 2015-12-20 ENCOUNTER — Ambulatory Visit (INDEPENDENT_AMBULATORY_CARE_PROVIDER_SITE_OTHER): Payer: BLUE CROSS/BLUE SHIELD | Admitting: Nurse Practitioner

## 2015-12-20 ENCOUNTER — Encounter: Payer: Self-pay | Admitting: Nurse Practitioner

## 2015-12-20 VITALS — BP 110/76 | HR 80 | Temp 97.3°F | Ht 64.0 in | Wt 144.4 lb

## 2015-12-20 DIAGNOSIS — R1084 Generalized abdominal pain: Secondary | ICD-10-CM | POA: Diagnosis not present

## 2015-12-20 DIAGNOSIS — R109 Unspecified abdominal pain: Secondary | ICD-10-CM | POA: Insufficient documentation

## 2015-12-20 DIAGNOSIS — A09 Infectious gastroenteritis and colitis, unspecified: Secondary | ICD-10-CM

## 2015-12-20 DIAGNOSIS — R197 Diarrhea, unspecified: Secondary | ICD-10-CM

## 2015-12-20 NOTE — Progress Notes (Signed)
Referring Provider: Juanda Chance, NP Primary Care Physician:  Damien Fusi, NP Primary GI:  Dr. Oneida Alar  Chief Complaint  Patient presents with  . Diarrhea    HPI:   Diana Graham is a 55 y.o. female who presents for urgent care visit due to diarrhea. The patient was last seen in our office 07/16/2015 for GERD and constipation. At that time it was noted her EGD was recently completed with a Schatzki's ring status post dilation, nonerosive gastritis. Colonoscopy recently completed as well 04/12/2015 which noted adenomas and recommended surveillance in 2020 with propofol. Dysphagia resolved, GERD symptoms much improved on PPI. Takes MiraLAX daily, does not feel she consistently has a productive bowel movement. She was recommended to start Linzess 72 g once daily.  Today she states her symptoms started last Thursday (8 days ago) with crampy abdominal pain and a couple loose stools. The weekend she stayed in the house all weekend with loose stools and frequent diarrhea. Seems to be easing up, but has reverted back to 4-5 bowel movements a day. First stool seems to be semi-solid but then loose/watery. When her loose stools returned, her abdominal cramping did NOT return. No current abdominal pain. Last bowel movement today in the office and she was able to get a sample for testing. Also noted foul odor. Increase gas as well. Denies hematochezia or melena. She is making a concerted effort to push fluids, She has had weight loss of about 5 pounds in the past week due to frequent bowel movements. No changes in her diet, no recent travel abroad, no recent antibiotics. Denies chest pain, dyspnea, dizziness, lightheadedness, syncope, near syncope. Denies any other upper or lower GI symptoms.  Past Medical History:  Diagnosis Date  . ANA positive   . Anxiety   . Asthma   . Hypercholesterolemia   . Migraines   . RA (rheumatoid arthritis) ([place])     Past Surgical History:  Procedure Laterality  Date  . ABDOMINAL HYSTERECTOMY    . CARPAL TUNNEL RELEASE    . COLONOSCOPY N/A 04/12/2015   Dr. Oneida Alar: 2 polyps ranging 6 to 12 mm in size in rectum and sigmoid colon. small internal hemorrhoids, adenomas. Surveillance in 2020. NEXT WITH PROPOFOL.   Marland Kitchen DILATION AND CURETTAGE OF UTERUS    . EPIDURAL BLOCK INJECTION N/A 2017   Herniated disc, trying to avoid surgery  . ESOPHAGEAL DILATION  04/12/2015   Procedure: ESOPHAGEAL DILATION;  Surgeon: Danie Binder, MD;  Location: [facility];  Service: Endoscopy;;  . ESOPHAGOGASTRODUODENOSCOPY N/A 04/12/2015   Dr. 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 NEXT WITH PROPOFOL.   Marland Kitchen HERNIA REPAIR    . WISDOM TOOTH EXTRACTION      Current Outpatient Prescriptions  Medication Sig Dispense Refill  . atorvastatin (LIPITOR) 20 MG tablet TAKE 1 TABLET BY ORAL ROUTE EVERY DAY  10  . FLUoxetine (PROZAC) 20 MG capsule Take 20 mg by mouth daily.    . folic acid (FOLVITE) 1 MG tablet Take 1 mg by mouth daily.  2  . gabapentin (NEURONTIN) 100 MG capsule Take 100 mg by mouth at bedtime.     . hydroxychloroquine (PLAQUENIL) 200 MG tablet Take 100 mg by mouth daily.    . methotrexate (RHEUMATREX) 2.5 MG tablet TAKE 6 TABLETS BY MOUTH WEEKLY ON SATURDAYS.  2  . pantoprazole (PROTONIX) 40 MG tablet Take 1 tablet (40 mg total) by mouth daily. 30 tablet 11  . topiramate (  TOPAMAX) 50 MG tablet Take 1 tablet by mouth at bedtime.  4  . Vitamin D, Ergocalciferol, (DRISDOL) 50000 units CAPS capsule Take 50,000 Units by mouth once a week. Takes on Saturdays.  2   No current facility-administered medications for this visit.     Allergies as of 12/20/2015 - Review Complete 12/20/2015  Allergen Reaction Noted  . Codeine Nausea And Vomiting 03/22/2015  . Demerol [meperidine]  03/01/2014  . Morphine and related  12/04/2013  . Penicillins  12/04/2013  . Sulfa antibiotics  12/04/2013  . Ciprofloxacin Rash 04/12/2015     Family History  Problem Relation Age of Onset  . Colon polyps Father   . Arthritis/Rheumatoid Father   . Atrial fibrillation Father   . Arthritis/Rheumatoid Mother   . Colon cancer Neg Hx     Social History   Social History  . Marital status: Married    Spouse name: N/A  . Number of children: N/A  . Years of education: N/A   Occupational History  . 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 Smoking status: Never Smoker  . Smokeless tobacco: Never Used  . Alcohol use No  . Drug use: No  . Sexual activity: Not Currently   Other Topics Concern  . None   Social History Narrative  . None    Review of Systems: General: Negative for anorexia, fever, chills, fatigue, weakness. ENT: Negative for hoarseness, difficulty swallowing. CV: Negative for chest pain, angina, palpitations, peripheral edema.  Respiratory: Negative for dyspnea at rest, dyspnea on exertion, cough, sputum, wheezing.  GI: See history of present illness. Endo: Negative for unusual weight change.  Heme: Negative for bruising or bleeding.   Physical Exam: BP 110/76   Pulse 80   Temp 97.3 F (36.3 C) (Oral)   Ht 5\' 4"  (1.626 m)   Wt 144 lb 6.4 oz (65.5 kg)   BMI 24.79 kg/m  General:   Alert and oriented. Pleasant and cooperative. Well-nourished and well-developed.  Ears:  Normal auditory acuity. Cardiovascular:  S1, S2 present without murmurs appreciated. Extremities without clubbing or edema. Respiratory:  Clear to auscultation bilaterally. No wheezes, rales, or rhonchi. No distress.  Gastrointestinal:  +BS, soft, non-tender and non-distended. No HSM noted. No guarding or rebound. No masses appreciated.  Rectal:  Deferred  Musculoskalatal:  Symmetrical without gross deformities Neurologic:  Alert and oriented x4;  grossly normal neurologically. Psych:  Alert and cooperative. Normal mood and affect. Heme/Lymph/Immune: No excessive  bruising noted.    12/20/2015 11:54 AM   Disclaimer: This note was dictated with voice recognition software. Similar sounding words can inadvertently be transcribed and may not be corrected upon review.

## 2015-12-20 NOTE — Patient Instructions (Signed)
1. [place] samples to the lab. 2. Take align probiotic daily for 30 days. I will provide you with coupons for this. 3. Call if worsening signs or symptoms. 4. Push fluids. 5. We will call you with the results of your stool tests and if they are negative and he continue with diarrhea we can provide you with symptomatic treatment for your diarrhea. 6. Return for follow-up in 4 weeks.

## 2015-12-20 NOTE — Assessment & Plan Note (Signed)
Initially having abdominal cramping with diarrhea 1 week ago. When her diarrhea resolved so that the cramping. Diarrhea returned however the cramping did not. She does have a history of diverticular disease but today her abdominal exam is completely benign with no tenderness noted. 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 Further management as noted above. Call with any worsening signs or symptoms including worsening abdominal pain. Return for follow-up in 4 weeks.

## 2015-12-20 NOTE — Assessment & Plan Note (Signed)
Patient with a history of constipation with new onset diarrhea approximately 1 week ago associated with abdominal cramping. Her symptoms were most severe over the weekend and seemed to be resolving. However, loose stools returned although abdominal cramping did not. 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 She is encouraging fluids and I have encouraged her to continue to do so. Some weight loss as would be expected from persistent diarrhea. She is collected a stool sample while in the office. I will check for C. difficile and GI pathogen panel. Recommend probiotic for 30 days and I have provided her with coupons, please call with worsening signs and symptoms, which fluids, return for follow-up in 4 weeks.

## 2015-12-21 LAB — CLOSTRIDIUM DIFFICILE BY PCR: CDIFFPCR: NOT DETECTED

## 2015-12-23 ENCOUNTER — Encounter: Payer: Self-pay | Admitting: Gastroenterology

## 2015-12-23 LAB — GASTROINTESTINAL PATHOGEN PANEL PCR
C. difficile Tox A/B, PCR: NOT DETECTED
Campylobacter, PCR: NOT DETECTED
Cryptosporidium, PCR: NOT DETECTED
E coli (ETEC) LT/ST PCR: NOT DETECTED
E coli (STEC) stx1/stx2, PCR: NOT DETECTED
E coli 0157, PCR: NOT DETECTED
Giardia lamblia, PCR: NOT DETECTED
Norovirus, PCR: NOT DETECTED
ROTAVIRUS, PCR: NOT DETECTED
Salmonella, PCR: NOT DETECTED
Shigella, PCR: NOT DETECTED

## 2015-12-23 NOTE — Progress Notes (Signed)
Pt is aware and said to tell Randall Hiss that she is a lot better.

## 2015-12-24 NOTE — Progress Notes (Signed)
LMOM for a return call.  

## 2015-12-26 NOTE — Progress Notes (Signed)
CC'ED TO PCP 

## 2015-12-31 NOTE — Progress Notes (Signed)
LMOM to call and mailing a letter to call.

## 2016-01-09 ENCOUNTER — Telehealth: Payer: Self-pay

## 2016-01-09 NOTE — Telephone Encounter (Signed)
See stool results. Pt received letter and called and is aware of results and plan.

## 2016-01-23 ENCOUNTER — Ambulatory Visit: Payer: BLUE CROSS/BLUE SHIELD | Admitting: Nurse Practitioner

## 2017-01-17 ENCOUNTER — Other Ambulatory Visit: Payer: Self-pay | Admitting: Gastroenterology

## 2017-01-21 NOTE — Telephone Encounter (Signed)
I called the RX to [facility] in [place], [place] on [address].

## 2018-03-30 ENCOUNTER — Encounter: Payer: Self-pay | Admitting: Gastroenterology

## 2018-12-28 ENCOUNTER — Encounter: Payer: Self-pay | Admitting: Gastroenterology

## 2019-02-02 ENCOUNTER — Telehealth: Payer: Self-pay | Admitting: *Deleted

## 2019-02-02 ENCOUNTER — Other Ambulatory Visit: Payer: Self-pay

## 2019-02-02 ENCOUNTER — Ambulatory Visit (INDEPENDENT_AMBULATORY_CARE_PROVIDER_SITE_OTHER): Payer: Self-pay | Admitting: *Deleted

## 2019-02-02 DIAGNOSIS — Z86010 Personal history of colonic polyps: Secondary | ICD-10-CM

## 2019-02-02 NOTE — Progress Notes (Addendum)
Gastroenterology Pre-Procedure Review  Request Date: 02/02/2019 Requesting Physician: 3 year recall, Last TCS 04/12/2015 done by Dr. Oneida Alar, tubular adenoma, approved by [facility] to triage for MAC  PATIENT REVIEW QUESTIONS: The patient responded to the following health history questions as indicated:    1. Diabetes Melitis: no 2. Joint replacements in the past 12 months: no 3. Major health problems in the past 3 months: no 4. Has an artificial valve or MVP: no 5. Has a defibrillator: no 6. Has been advised in past to take antibiotics in advance of a procedure like teeth cleaning: no 7. Family history of colon cancer: no  8. Alcohol Use: no 9. Illicit drug Use: no 10. History of sleep apnea: no  11. History of coronary artery or other vascular stents placed within the last 12 months: no 12. History of any prior anesthesia complications: no 13. There is no height or weight on file to calculate BMI.ht: 5'3 wt: 153 lbs    MEDICATIONS & ALLERGIES:    Patient reports the following regarding taking any blood thinners:   Plavix? no Aspirin? no Coumadin? no Brilinta? no Xarelto? no Eliquis? no Pradaxa? no Savaysa? no Effient? no  Patient confirms/reports the following medications:  Current Outpatient Medications  Medication Sig Dispense Refill  . atorvastatin (LIPITOR) 20 MG tablet 40 mg.   10  . Cyanocobalamin (B-12 PO) Take by mouth daily.    Marland Kitchen FLUoxetine (PROZAC) 20 MG capsule Take 20 mg by mouth daily.    . folic acid (FOLVITE) 1 MG tablet Take 1 mg by mouth daily.  2  . gabapentin (NEURONTIN) 100 MG capsule Take 200 mg by mouth at bedtime.     . hydroxychloroquine (PLAQUENIL) 200 MG tablet Take 200 mg by mouth daily.     . methotrexate (RHEUMATREX) 2.5 MG tablet TAKE 6 TABLETS BY MOUTH WEEKLY ON SATURDAYS.  2  . omeprazole (PRILOSEC) 20 MG capsule Take 20 mg by mouth as needed.    . topiramate (TOPAMAX) 50 MG tablet 100 mg.   4  . Vitamin D, Ergocalciferol, (DRISDOL) 50000 units  CAPS capsule Take 50,000 Units by mouth once a week. Takes on Saturdays.  2   No current facility-administered medications for this visit.     Patient confirms/reports the following allergies:  Allergies  Allergen Reactions  . Codeine Nausea And Vomiting  . Demerol [Meperidine]   . Morphine And Related   . Penicillins     PATIENT'S MOM HAD ANAPHYLACTIC REACTION TO THIS MEDICATION AND WAS TOLD NOT TO GIVER TO HER CHILDREN.  Has patient had a PCN reaction causing immediate rash, facial/tongue/throat swelling, SOB or lightheadedness with hypotension: unknown Has patient had a PCN reaction causing severe rash involving mucus membranes or skin necrosis: unknown Has patient had a PCN reaction that required hospitalization unknown Has patient had a PCN reaction occurring within the last 10 years: unknown If all of the abo  . Sulfa Antibiotics   . Ciprofloxacin Rash    No orders of the defined types were placed in this encounter.   AUTHORIZATION INFORMATION Primary Insurance: Jennelle Human #:[id number], Group#: [id number]  Pre-Cert / Josem Kaufmann required: No, file to local BCBS  SCHEDULE INFORMATION: Procedure has been scheduled as follows:  Date: 05/23/2019, Time: 9:45 Location: [facility] with Dr. Oneida Alar  This Gastroenterology Pre-Precedure Review Form is being routed to the following provider(s): Aliene Altes, PA

## 2019-02-02 NOTE — Progress Notes (Signed)
Okay to schedule with MAC.

## 2019-02-02 NOTE — Progress Notes (Signed)
Pt is aware that Dr. Nona Dell is booked up until March.  She is aware that I will call her to schedule her on a propofol day once March procedure schedules are available.

## 2019-02-02 NOTE — Telephone Encounter (Signed)
Pt has triage scheduled for late this evening.  According to her last procedure, she will need MAC.  Is it ok for me to triage by phone or does she need an ov first?

## 2019-02-02 NOTE — Telephone Encounter (Signed)
Triage for TCS WITH MAC.

## 2019-02-14 MED ORDER — NA SULFATE-K SULFATE-MG SULF 17.5-3.13-1.6 GM/177ML PO SOLN: 1.0000 | Freq: Once | ORAL | 0 refills | Status: AC

## 2019-02-14 NOTE — Addendum Note (Signed)
Addended by: Metro Kung on: 02/14/2019 03:01 PM   Modules accepted: Orders

## 2019-02-14 NOTE — Patient Instructions (Signed)
Diana Graham  April 30, 1960 MRN: [id number]     Procedure Date: 05/23/2019 Time to register: 8:45 am Place to register: [facility] Procedure Time: 9:45 am Scheduled provider:  Dr. Oneida Alar    PREPARATION FOR COLONOSCOPY WITH SUPREP BOWEL PREP KIT  Note: Suprep Bowel Prep Kit is a split-dose (2day) regimen. Consumption of BOTH 6-ounce bottles is required for a complete prep.  Please notify us immediately if you are diabetic, take iron supplements, or if you are on Coumadin or any other blood thinners.                                                                                                                                             1 DAY BEFORE PROCEDURE:  DATE: 05/22/2019  DAY: Monday Continue clear liquids the entire day - NO SOLID FOOD.    At 6:00pm: Complete steps 1 through 4 below, using ONE (1) 6-ounce bottle, before going to bed. Step 1:  Pour ONE (1) 6-ounce bottle of SUPREP liquid into the mixing container.  Step 2:  Add cool drinking water to the 16 ounce line on the container and mix.  Note: Dilute the solution concentrate as directed prior to use. Step 3:  DRINK ALL the liquid in the container. Step 4:  You MUST drink an additional two (2) or more 16 ounce containers of water over the next one (1) hour.   Continue clear liquids.  DAY OF PROCEDURE:   DATE: 05/23/2019   DAY: Tuesday If you take medications for your heart, blood pressure, or breathing, you may take these medications.   5 hours before your procedure at :  4:45 am Step 1:  Pour ONE (1) 6-ounce bottle of SUPREP liquid into the mixing container.  Step 2:  Add cool drinking water to the 16 ounce line on the container and mix.  Note: Dilute the solution concentrate as directed prior to use. Step 3:  DRINK ALL the liquid in the container. Step 4:  You MUST drink an additional two (2) or more 16 ounce containers of water over the next one (1) hour. You MUST complete the final glass of water at least 3  hours before your colonoscopy. Nothing by mouth past 6:45 am  You may take your morning medications with sip of water unless we have instructed otherwise.    Please see below for Dietary Information.  CLEAR LIQUIDS INCLUDE:  Water Jello (NOT red in color)   Ice Popsicles (NOT red in color)   Tea (sugar ok, no milk/cream) Powdered fruit flavored drinks  Coffee (sugar ok, no milk/cream) Gatorade/ Lemonade/ Kool-Aid  (NOT red in color)   Juice: apple, white grape, white cranberry Soft drinks  Clear bullion, consomme, broth (fat free beef/chicken/vegetable)  Carbonated beverages (any kind)  Strained chicken noodle soup Hard Candy   Remember: Clear liquids are liquids that will allow  you to see your fingers on the other side of a clear glass. Be sure liquids are NOT red in color, and not cloudy, but CLEAR.  DO NOT EAT OR DRINK ANY OF THE FOLLOWING:  Dairy products of any kind   Cranberry juice Tomato juice / V8 juice   Grapefruit juice Orange juice     Red grape juice  Do not eat any solid foods, including such foods as: cereal, oatmeal, yogurt, fruits, vegetables, creamed soups, eggs, bread, crackers, pureed foods in a blender, etc.   HELPFUL HINTS FOR DRINKING PREP SOLUTION:   Make sure prep is extremely cold. Mix and refrigerate the the morning of the prep. You may also put in the freezer.   You may try mixing some Crystal Light or Country Time Lemonade if you prefer. Mix in small amounts; add more if necessary.  Try drinking through a straw  Rinse mouth with water or a mouthwash between glasses, to remove after-taste.  Try sipping on a cold beverage /ice/ popsicles between glasses of prep.  Place a piece of sugar-free hard candy in mouth between glasses.  If you become nauseated, try consuming smaller amounts, or stretch out the time between glasses. Stop for 30-60 minutes, then slowly start back drinking.     OTHER INSTRUCTIONS  You will need a responsible adult at  least 56 years of age to accompany you and drive you home. This person must remain in the waiting room during your procedure. The hospital will cancel your procedure if you do not have a responsible adult with you.   1. Wear loose fitting clothing that is easily removed. 2. Leave jewelry and other valuables at home.  3. Remove all body piercing jewelry and leave at home. 4. Total time from sign-in until discharge is approximately 2-3 hours. 5. You should go home directly after your procedure and rest. You can resume normal activities the day after your procedure. 6. The day of your procedure you should not:  Drive  Make legal decisions  Operate machinery  Drink alcohol  Return to work   You may call the office (Dept: [phone]) before 5:00pm, or page the doctor on call [phone]) after 5:00pm, for further instructions, if necessary.   Insurance Information YOU WILL NEED TO CHECK WITH YOUR INSURANCE COMPANY FOR THE BENEFITS OF COVERAGE YOU HAVE FOR THIS PROCEDURE.  UNFORTUNATELY, NOT ALL INSURANCE COMPANIES HAVE BENEFITS TO COVER ALL OR PART OF THESE TYPES OF PROCEDURES.  IT IS YOUR RESPONSIBILITY TO CHECK YOUR BENEFITS, HOWEVER, WE WILL BE GLAD TO ASSIST YOU WITH ANY CODES YOUR INSURANCE COMPANY MAY NEED.    PLEASE NOTE THAT MOST INSURANCE COMPANIES WILL NOT COVER A SCREENING COLONOSCOPY FOR PEOPLE UNDER THE AGE OF 50  IF YOU HAVE BCBS INSURANCE, YOU MAY HAVE BENEFITS FOR A SCREENING COLONOSCOPY BUT IF POLYPS ARE FOUND THE DIAGNOSIS WILL CHANGE AND THEN YOU MAY HAVE A DEDUCTIBLE THAT WILL NEED TO BE MET. SO PLEASE MAKE SURE YOU CHECK YOUR BENEFITS FOR A SCREENING COLONOSCOPY AS WELL AS A DIAGNOSTIC COLONOSCOPY.

## 2019-02-14 NOTE — Progress Notes (Signed)
Called pt and scheduled her procedure with MAC for 05/23/2019.  Pt is aware to arrive at 8:45.  Pt said she did not have time to go over her prep information since she was at work.  She requested me to mail it to her and she will review it.  She was advised to call me if she has any questions.  Pt voiced understanding.

## 2019-02-14 NOTE — Addendum Note (Signed)
Addended by: Metro Kung on: 02/14/2019 03:13 PM   Modules accepted: Orders, SmartSet

## 2019-02-16 ENCOUNTER — Encounter: Payer: Self-pay | Admitting: *Deleted

## 2019-05-19 ENCOUNTER — Other Ambulatory Visit: Payer: Self-pay

## 2019-05-19 ENCOUNTER — Encounter (HOSPITAL_COMMUNITY): Payer: Self-pay

## 2019-05-22 ENCOUNTER — Other Ambulatory Visit: Payer: Self-pay

## 2019-05-22 ENCOUNTER — Other Ambulatory Visit (HOSPITAL_COMMUNITY)
Admission: RE | Admit: 2019-05-22 | Discharge: 2019-05-22 | Disposition: A | Discharge status: Home / Self Care | Payer: BC Managed Care – PPO | Source: Ambulatory Visit | Attending: Gastroenterology | Admitting: Gastroenterology

## 2019-05-22 ENCOUNTER — Encounter (HOSPITAL_COMMUNITY)
Admission: RE | Admit: 2019-05-22 | Discharge: 2019-05-22 | Disposition: A | Discharge status: Home / Self Care | Payer: BC Managed Care – PPO | Source: Ambulatory Visit | Attending: Gastroenterology | Admitting: Gastroenterology

## 2019-05-22 DIAGNOSIS — Z01812 Encounter for preprocedural laboratory examination: Secondary | ICD-10-CM | POA: Diagnosis present

## 2019-05-22 DIAGNOSIS — Z20822 Contact with and (suspected) exposure to covid-19: Secondary | ICD-10-CM | POA: Insufficient documentation

## 2019-05-23 ENCOUNTER — Ambulatory Visit (HOSPITAL_COMMUNITY): Payer: BC Managed Care – PPO | Admitting: Certified Registered Nurse Anesthetist

## 2019-05-23 ENCOUNTER — Encounter (HOSPITAL_COMMUNITY): Payer: Self-pay | Admitting: Gastroenterology

## 2019-05-23 ENCOUNTER — Ambulatory Visit (HOSPITAL_COMMUNITY): Admit: 2019-05-23 | Payer: BC Managed Care – PPO | Admitting: Gastroenterology

## 2019-05-23 ENCOUNTER — Encounter (HOSPITAL_COMMUNITY)
Admission: RE | Disposition: A | Discharge status: Home / Self Care | Payer: Self-pay | Source: Ambulatory Visit | Attending: Gastroenterology

## 2019-05-23 ENCOUNTER — Ambulatory Visit (HOSPITAL_COMMUNITY)
Admission: RE | Admit: 2019-05-23 | Discharge: 2019-05-23 | Disposition: A | Discharge status: Home / Self Care | Payer: BC Managed Care – PPO | Source: Ambulatory Visit | Attending: Gastroenterology | Admitting: Gastroenterology

## 2019-05-23 DIAGNOSIS — K635 Polyp of colon: Secondary | ICD-10-CM

## 2019-05-23 DIAGNOSIS — J45909 Unspecified asthma, uncomplicated: Secondary | ICD-10-CM | POA: Insufficient documentation

## 2019-05-23 DIAGNOSIS — F419 Anxiety disorder, unspecified: Secondary | ICD-10-CM | POA: Diagnosis not present

## 2019-05-23 DIAGNOSIS — E7800 Pure hypercholesterolemia, unspecified: Secondary | ICD-10-CM | POA: Insufficient documentation

## 2019-05-23 DIAGNOSIS — Z79899 Other long term (current) drug therapy: Secondary | ICD-10-CM | POA: Diagnosis not present

## 2019-05-23 DIAGNOSIS — K648 Other hemorrhoids: Secondary | ICD-10-CM | POA: Insufficient documentation

## 2019-05-23 DIAGNOSIS — K5730 Diverticulosis of large intestine without perforation or abscess without bleeding: Secondary | ICD-10-CM | POA: Insufficient documentation

## 2019-05-23 DIAGNOSIS — Z860100 Personal history of colon polyps, unspecified: Secondary | ICD-10-CM

## 2019-05-23 DIAGNOSIS — Q438 Other specified congenital malformations of intestine: Secondary | ICD-10-CM | POA: Insufficient documentation

## 2019-05-23 DIAGNOSIS — K219 Gastro-esophageal reflux disease without esophagitis: Secondary | ICD-10-CM | POA: Diagnosis not present

## 2019-05-23 DIAGNOSIS — M069 Rheumatoid arthritis, unspecified: Secondary | ICD-10-CM | POA: Insufficient documentation

## 2019-05-23 DIAGNOSIS — D123 Benign neoplasm of transverse colon: Secondary | ICD-10-CM | POA: Insufficient documentation

## 2019-05-23 DIAGNOSIS — G43909 Migraine, unspecified, not intractable, without status migrainosus: Secondary | ICD-10-CM | POA: Insufficient documentation

## 2019-05-23 DIAGNOSIS — K644 Residual hemorrhoidal skin tags: Secondary | ICD-10-CM | POA: Diagnosis not present

## 2019-05-23 HISTORY — PX: POLYPECTOMY: SHX5525

## 2019-05-23 HISTORY — PX: COLONOSCOPY WITH PROPOFOL: SHX5780

## 2019-05-23 LAB — SARS CORONAVIRUS 2 (TAT 6-24 HRS): SARS Coronavirus 2: NEGATIVE

## 2019-05-23 SURGERY — COLONOSCOPY WITH PROPOFOL
Anesthesia: Monitor Anesthesia Care

## 2019-05-23 MED ORDER — STERILE WATER FOR IRRIGATION IR SOLN
Status: DC | PRN
  Administered 2019-05-23: 1.5 mL

## 2019-05-23 MED ORDER — KETAMINE HCL 50 MG/5ML IJ SOSY
PREFILLED_SYRINGE | INTRAMUSCULAR | Status: AC
  Filled 2019-05-23: qty 5

## 2019-05-23 MED ORDER — PROPOFOL 10 MG/ML IV BOLUS
INTRAVENOUS | Status: DC | PRN
  Administered 2019-05-23 (×2): 50 mg via INTRAVENOUS

## 2019-05-23 MED ORDER — PROPOFOL 10 MG/ML IV BOLUS
INTRAVENOUS | Status: AC
  Filled 2019-05-23: qty 40

## 2019-05-23 MED ORDER — PROPOFOL 500 MG/50ML IV EMUL
INTRAVENOUS | Status: DC | PRN
  Administered 2019-05-23: 150 ug/kg/min via INTRAVENOUS

## 2019-05-23 MED ORDER — KETAMINE HCL 10 MG/ML IJ SOLN
INTRAMUSCULAR | Status: DC | PRN
  Administered 2019-05-23: 20 mg via INTRAVENOUS

## 2019-05-23 MED ORDER — LIDOCAINE HCL (CARDIAC) PF 100 MG/5ML IV SOSY
PREFILLED_SYRINGE | INTRAVENOUS | Status: DC | PRN
  Administered 2019-05-23: 50 mg via INTRATRACHEAL

## 2019-05-23 MED ORDER — LACTATED RINGERS IV SOLN
INTRAVENOUS | Status: DC
  Administered 2019-05-23: 1000 mL via INTRAVENOUS

## 2019-05-23 MED ORDER — PROPOFOL 10 MG/ML IV BOLUS: INTRAVENOUS | Status: DC | PRN

## 2019-05-23 NOTE — H&P (Signed)
Primary Care Physician:  Arlyss Gandy, MD Primary Gastroenterologist:  Dr. Oneida Alar  Pre-Procedure History & Physical: HPI:  Diana Graham is a 59 y.o. female here for  PERSONAL HISTORY OF POLYPS.  Past Medical History:  Diagnosis Date  . ANA positive   . Anxiety   . Asthma   . Hypercholesterolemia   . Migraines   . RA (rheumatoid arthritis) ([place])     Past Surgical History:  Procedure Laterality Date  . ABDOMINAL HYSTERECTOMY    . CARPAL TUNNEL RELEASE Right   . COLONOSCOPY N/A 04/12/2015   Dr. Oneida Alar: 2 polyps ranging 6 to 12 mm in size in rectum and sigmoid colon. small internal hemorrhoids, adenomas. Surveillance in 2020. NEXT WITH PROPOFOL.   Marland Kitchen DILATION AND CURETTAGE OF UTERUS    . EPIDURAL BLOCK INJECTION N/A 2017   Herniated disc, trying to avoid surgery  . ESOPHAGEAL DILATION  04/12/2015   Procedure: ESOPHAGEAL DILATION;  Surgeon: Danie Binder, MD;  Location: [facility];  Service: Endoscopy;;  . ESOPHAGOGASTRODUODENOSCOPY N/A 04/12/2015   Dr. 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 NEXT WITH PROPOFOL.   Marland Kitchen HERNIA REPAIR    . ROTATOR CUFF REPAIR Right   . WISDOM TOOTH EXTRACTION      Prior to Admission medications   Medication Sig Start Date End Date Taking? Authorizing Provider  b complex vitamins tablet Take 1 tablet by mouth daily.   Yes [provider]  estradiol (VIVELLE-DOT) 0.0375 MG/24HR Place 1 patch onto the skin 2 (two) times a week. 02/23/19  Yes [provider]  FLUoxetine (PROZAC) 20 MG capsule Take 20 mg by mouth daily.   Yes [provider]  folic acid (FOLVITE) 1 MG tablet Take 1 mg by mouth daily. 03/02/15  Yes [provider]  gabapentin (NEURONTIN) 300 MG capsule Take 300 mg by mouth 2 (two) times daily.    Yes [provider]  hydroxychloroquine (PLAQUENIL) 200 MG tablet Take 200 mg by mouth daily.    Yes [provider]  methotrexate  (RHEUMATREX) 2.5 MG tablet Take 15 mg by mouth once a week.  03/14/15  Yes [provider]  topiramate (TOPAMAX) 50 MG tablet Take 50 mg by mouth 2 (two) times daily.  03/09/15  Yes [provider]  Vitamin D, Ergocalciferol, (DRISDOL) 50000 units CAPS capsule Take 50,000 Units by mouth once a week.  03/02/15  Yes [provider]  atorvastatin (LIPITOR) 40 MG tablet Take 40 mg by mouth daily.  03/12/15   [provider]    Allergies as of 02/14/2019 - Review Complete 02/02/2019  Allergen Reaction Noted  . Codeine Nausea And Vomiting 03/22/2015  . Demerol [meperidine]  03/01/2014  . Morphine and related  12/04/2013  . Penicillins  12/04/2013  . Sulfa antibiotics  12/04/2013  . Ciprofloxacin Rash 04/12/2015    Family History  Problem Relation Age of Onset  . Colon polyps Father   . Arthritis/Rheumatoid Father   . Atrial fibrillation Father   . Arthritis/Rheumatoid Mother   . Colon cancer Neg Hx     Social History   Socioeconomic History  . Marital status: Married    Spouse name: Not on file  . Number of children: Not on file  . Years of education: Not on file  . Highest education level: Not on file  Occupational History  . 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 Smoking status: Never Smoker  . Smokeless tobacco: Never Used  Substance and Sexual Activity  . Alcohol use: No    Alcohol/week: 0.0 standard drinks  . Drug use: No  . Sexual activity: Not Currently  Other Topics Concern  . Not on file  Social History Narrative  . Not on file   Social Determinants of Health   Financial Resource Strain:   . Difficulty of Paying Living Expenses: Not on file  Food Insecurity:   . Worried About Charity fundraiser in the Last Year: Not on file  . Ran Out of Food in the Last Year: Not on file  Transportation Needs:   . Lack of Transportation (Medical): Not on file  . Lack of  Transportation (Non-Medical): Not on file  Physical Activity:   . Days of Exercise per Week: Not on file  . Minutes of Exercise per Session: Not on file  Stress:   . Feeling of Stress : Not on file  Social Connections:   . Frequency of Communication with Friends and Family: Not on file  . Frequency of Social Gatherings with Friends and Family: Not on file  . Attends Religious Services: Not on file  . Active Member of Clubs or Organizations: Not on file  . Attends Archivist Meetings: Not on file  . Marital Status: Not on file  Intimate Partner Violence:   . Fear of Current or Ex-Partner: Not on file  . Emotionally Abused: Not on file  . Physically Abused: Not on file  . Sexually Abused: Not on file    Review of Systems: See HPI, otherwise negative ROS   Physical Exam: BP 118/77   Pulse 66   Temp 97.7 F (36.5 C)   Resp 20   Ht 5\' 4"  (1.626 m)   Wt 68 kg   SpO2 97%   BMI 25.75 kg/m  General:   Alert,  pleasant and cooperative in NAD Head:  Normocephalic and atraumatic. Neck:  Supple; Lungs:  Clear throughout to auscultation.    Heart:  Regular rate and rhythm. Abdomen:  Soft, nontender and nondistended. Normal bowel sounds, without guarding, and without rebound.   Neurologic:  Alert and  oriented x4;  grossly normal neurologically.  Impression/Plan:      PERSONAL HISTORY OF POLYPS.  PLAN: 1. TCS TODAY. DISCUSSED PROCEDURE, BENEFITS, & RISKS: < 1% chance of medication reaction, bleeding, perforation, ASPIRATION, or rupture of spleen/liver requiring surgery to fix it and missed polyps < 1 cm 10-20% of the time.

## 2019-05-23 NOTE — Anesthesia Preprocedure Evaluation (Signed)
Anesthesia Evaluation  Patient identified by MRN, date of birth, ID band Patient awake    Reviewed: Allergy & Precautions, H&P , NPO status , Patient's Chart, lab work & pertinent test results, reviewed documented beta blocker date and time   Airway Mallampati: II  TM Distance: >3 FB Neck ROM: Full    Dental  (+) Teeth Intact   Pulmonary asthma ,    Pulmonary exam normal        Cardiovascular negative cardio ROS Normal cardiovascular exam     Neuro/Psych  Headaches, PSYCHIATRIC DISORDERS Anxiety    GI/Hepatic Neg liver ROS, GERD  ,  Endo/Other  negative endocrine ROS  Renal/GU negative Renal ROS     Musculoskeletal  (+) Arthritis , Rheumatoid disorders,    Abdominal Normal abdominal exam  (+)   Peds  Hematology negative hematology ROS (+)   Anesthesia Other Findings   Reproductive/Obstetrics                             Anesthesia Physical Anesthesia Plan  ASA: II  Anesthesia Plan: MAC   Post-op Pain Management:    Induction: Intravenous  PONV Risk Score and Plan: 2 and Propofol infusion  Airway Management Planned: Nasal Cannula  Additional Equipment:   Intra-op Plan:   Post-operative Plan:   Informed Consent: I have reviewed the patients History and Physical, chart, labs and discussed the procedure including the risks, benefits and alternatives for the proposed anesthesia with the patient or authorized representative who has indicated his/her understanding and acceptance.       Plan Discussed with:   Anesthesia Plan Comments:         Anesthesia Quick Evaluation

## 2019-05-23 NOTE — Transfer of Care (Signed)
Immediate Anesthesia Transfer of Care Note  Patient: Diana Graham  Procedure(s) Performed: COLONOSCOPY WITH PROPOFOL (N/A ) POLYPECTOMY  Patient Location: PACU  Anesthesia Type:MAC  Level of Consciousness: awake, alert  and oriented  Airway & Oxygen Therapy: Patient Spontanous Breathing  Post-op Assessment: Report given to RN, Post -op Vital signs reviewed and stable and Patient moving all extremities X 4  Post vital signs: Reviewed and stable  Last Vitals:  Vitals Value Taken Time  BP 110/76 05/23/19 1048  Temp    Pulse 75 05/23/19 1050  Resp 14 05/23/19 1050  SpO2 99 % 05/23/19 1050  Vitals shown include unvalidated device data.  Last Pain:  Vitals:   05/23/19 1005  PainSc: 0-No pain         Complications: No apparent anesthesia complications

## 2019-05-23 NOTE — Op Note (Signed)
[facility] Patient Name: Diana Graham Procedure Date: 05/23/2019 9:45 AM MRN: [id number] Date of Birth: 09-02-1960 Attending MD: Barney Drain MD, MD CSN: [id number] Age: 59 Admit Type: Outpatient Procedure:                Colonoscopy WITH COLD FORCEPS POLYPECTOMY Indications:              Personal history of colonic polyps Providers:                Barney Drain MD, MD, Charlsie Quest. Theda Sers RN, RN,                            Aram Candela Referring MD:             [facility], MD Medicines:                Propofol per Anesthesia Complications:            No immediate complications. Estimated Blood Loss:     Estimated blood loss was minimal. Procedure:                Pre-Anesthesia Assessment:                           - Prior to the procedure, a History and Physical                            was performed, and patient medications and                            allergies were reviewed. The patient's tolerance of                            previous anesthesia was also reviewed. The risks                            and benefits of the procedure and the sedation                            options and risks were discussed with the patient.                            All questions were answered, and informed consent                            was obtained. Prior Anticoagulants: The patient has                            taken no previous anticoagulant or antiplatelet                            agents. ASA Grade Assessment: II - A patient with                            mild systemic disease. After reviewing the risks  and benefits, the patient was deemed in                            satisfactory condition to undergo the procedure.                            After obtaining informed consent, the colonoscope                            was passed under direct vision. Throughout the                            procedure, the patient's blood pressure, pulse, and                             oxygen saturations were monitored continuously. The                            [id number]) scope was introduced through                            the anus and advanced to the the cecum, identified                            by appendiceal orifice and ileocecal valve. The                            colonoscopy was somewhat difficult due to a                            tortuous colon. Successful completion of the                            procedure was aided by straightening and shortening                            the scope to obtain bowel loop reduction and                            COLOWRAP. The patient tolerated the procedure well.                            The quality of the bowel preparation was excellent.                            The ileocecal valve, appendiceal orifice, and                            rectum were photographed. Scope In: 10:19:05 AM Scope Out: 10:40:30 AM Scope Withdrawal Time: 0 hours 18 minutes 51 seconds  Total Procedure Duration: 0 hours 21 minutes 25 seconds  Findings:      Three sessile polyps were found in the sigmoid colon and transverse       colon. The polyps  were 3 to 5 mm in size. These polyps were removed with       a cold snare. Resection and retrieval were complete.      Multiple small and large-mouthed diverticula were found in the       recto-sigmoid colon, sigmoid colon and transverse colon.      Internal hemorrhoids were found.      The recto-sigmoid colon and sigmoid colon were mildly tortuous. Impression:               - Three 3 to 5 mm polyps in the sigmoid colon(2)                            and in the transverse colon, removed with a cold                            snare. Resected and retrieved.                           - Diverticulosis in the recto-sigmoid colon, in the                            sigmoid colon and in the transverse colon.                           - External and internal  hemorrhoids.                           - Tortuous colon. Moderate Sedation:      Per Anesthesia Care Recommendation:           - Patient has a contact number available for                            emergencies. The signs and symptoms of potential                            delayed complications were discussed with the                            patient. Return to normal activities tomorrow.                            Written discharge instructions were provided to the                            patient.                           - High fiber diet.                           - Continue present medications.                           - Await pathology results.                           -  Repeat colonoscopy date to be determined after                            pending pathology results are reviewed for                            surveillance. Procedure Code(s):        --- Professional ---                           [phone], Colonoscopy, flexible; with removal of                            tumor(s), polyp(s), or other lesion(s) by snare                            technique Diagnosis Code(s):        --- Professional ---                           K63.5, Polyp of colon                           K64.8, Other hemorrhoids                           Z86.010, Personal history of colonic polyps                           K57.30, Diverticulosis of large intestine without                            perforation or abscess without bleeding                           Q43.8, Other specified congenital malformations of                            intestine CPT copyright 2019 American Medical Association. All rights reserved. The codes documented in this report are preliminary and upon coder review may  be revised to meet current compliance requirements. Barney Drain, MD Barney Drain MD, MD 05/23/2019 11:35:26 AM This report has been signed electronically. Number of Addenda: 0

## 2019-05-23 NOTE — Discharge Instructions (Signed)
You have small internal hemorrhoids and diverticulosis IN YOUR LEFT COLON. YOU HAD THREE SMALL POLYPS REMOVED.    DRINK WATER TO KEEP YOUR URINE LIGHT YELLOW.  FOLLOW A HIGH FIBER DIET. AVOID ITEMS THAT CAUSE BLOATING. See info below.   USE PREPARATION H FOUR TIMES  A DAY IF NEEDED TO RELIEVE RECTAL PAIN/PRESSURE/BLEEDING.\   YOUR BIOPSY RESULTS WILL BE BACK IN 5 BUSINESS DAYS.  Next colonoscopy in 3-5 years.  Colonoscopy Care After Read the instructions outlined below and refer to this sheet in the next week. These discharge instructions provide you with general information on caring for yourself after you leave the hospital. While your treatment has been planned according to the most current medical practices available, unavoidable complications occasionally occur. If you have any problems or questions after discharge, call DR. Lenon Kuennen, [phone].  ACTIVITY  You may resume your regular activity, but move at a slower pace for the next 24 hours.   Take frequent rest periods for the next 24 hours.   Walking will help get rid of the air and reduce the bloated feeling in your belly (abdomen).   No driving for 24 hours (because of the medicine (anesthesia) used during the test).   You may shower.   Do not sign any important legal documents or operate any machinery for 24 hours (because of the anesthesia used during the test).    NUTRITION  Drink plenty of fluids.   You may resume your normal diet as instructed by your doctor.   Begin with a light meal and progress to your normal diet. Heavy or fried foods are harder to digest and may make you feel sick to your stomach (nauseated).   Avoid alcoholic beverages for 24 hours or as instructed.    MEDICATIONS  You may resume your normal medications.   WHAT YOU CAN EXPECT TODAY  Some feelings of bloating in the abdomen.   Passage of more gas than usual.   Spotting of blood in your stool or on the toilet paper  .  IF  YOU HAD POLYPS REMOVED DURING THE COLONOSCOPY:  Eat a soft diet IF YOU HAVE NAUSEA, BLOATING, ABDOMINAL PAIN, OR VOMITING.    FINDING OUT THE RESULTS OF YOUR TEST Not all test results are available during your visit. DR. Oneida Alar WILL CALL YOU WITHIN 14 DAYS OF YOUR PROCEDUE WITH YOUR RESULTS. Do not assume everything is normal if you have not heard from DR. Ivone Licht, CALL HER OFFICE AT [phone].  SEEK IMMEDIATE MEDICAL ATTENTION AND CALL THE OFFICE: [phone] IF:  You have more than a spotting of blood in your stool.   Your belly is swollen (abdominal distention).   You are nauseated or vomiting.   You have a temperature over 101F.   You have abdominal pain or discomfort that is severe or gets worse throughout the day.  High-Fiber Diet A high-fiber diet changes your normal diet to include more whole grains, legumes, fruits, and vegetables. Changes in the diet involve replacing refined carbohydrates with unrefined foods. The calorie level of the diet is essentially unchanged. The Dietary Reference Intake (recommended amount) for adult males is 38 grams per day. For adult females, it is 25 grams per day. Pregnant and lactating women should consume 28 grams of fiber per day. Fiber is the intact part of a plant that is not broken down during digestion. Functional fiber is fiber that has been isolated from the plant to provide a beneficial effect in the body.  PURPOSE  Increase stool bulk.   Ease and regulate bowel movements.   Lower cholesterol.   REDUCE RISK OF COLON CANCER  INDICATIONS THAT YOU NEED MORE FIBER  Constipation and hemorrhoids.   Uncomplicated diverticulosis (intestine condition) and irritable bowel syndrome.   Weight management.   As a protective measure against hardening of the arteries (atherosclerosis), diabetes, and cancer.   GUIDELINES FOR INCREASING FIBER IN THE DIET  Start adding fiber to the diet slowly. A gradual increase of about 5 more grams (2  servings of most fruits or vegetables) per day is best. Too rapid an increase in fiber may result in constipation, flatulence, and bloating.   Drink enough water and fluids to keep your urine clear or pale yellow. Water, juice, or caffeine-free drinks are recommended. Not drinking enough fluid may cause constipation.   Eat a variety of high-fiber foods rather than one type of fiber.   Try to increase your intake of fiber through using high-fiber foods rather than fiber pills or supplements that contain small amounts of fiber.   The goal is to change the types of food eaten. Do not supplement your present diet with high-fiber foods, but replace foods in your present diet.    Polyps, Colon  A polyp is extra tissue that grows inside your body. Colon polyps grow in the large intestine. The large intestine, also called the colon, is part of your digestive system. It is a long, hollow tube at the end of your digestive tract where your body makes and stores stool. Most polyps are not dangerous. They are benign. This means they are not cancerous. But over time, some types of polyps can turn into cancer. Polyps that are smaller than a pea are usually not harmful. But larger polyps could someday become or may already be cancerous. To be safe, doctors remove all polyps and test them.   PREVENTION There is not one sure way to prevent polyps. You might be able to lower your risk of getting them if you:  Eat more fruits and vegetables and less fatty food.   Do not smoke.   Avoid alcohol.   Exercise every day.   Lose weight if you are overweight.   Eating more calcium and folate can also lower your risk of getting polyps. Some foods that are rich in calcium are milk, cheese, and broccoli. Some foods that are rich in folate are chickpeas, kidney beans, and spinach.    Diverticulosis Diverticulosis is a common condition that develops when small pouches (diverticula) form in the wall of the colon. The  risk of diverticulosis increases with age. It happens more often in people who eat a low-fiber diet. Most individuals with diverticulosis have no symptoms. Those individuals with symptoms usually experience belly (abdominal) pain, constipation, or loose stools (diarrhea).  HOME CARE INSTRUCTIONS  Increase the amount of fiber in your diet as directed by your caregiver or dietician. This may reduce symptoms of diverticulosis.   Drink at least 6 to 8 glasses of water each day to prevent constipation.   Try not to strain when you have a bowel movement.   Avoiding nuts and seeds to prevent complications is NOT NECESSARY.   FOODS HAVING HIGH FIBER CONTENT INCLUDE:  Fruits. Apple, peach, pear, tangerine, raisins, prunes.   Vegetables. Brussels sprouts, asparagus, broccoli, cabbage, carrot, cauliflower, romaine lettuce, spinach, summer squash, tomato, winter squash, zucchini.   Starchy Vegetables. Baked beans, kidney beans, lima beans, split peas, lentils, potatoes (with skin).    SEEK  IMMEDIATE MEDICAL CARE IF:  You develop increasing pain or severe bloating.   You have an oral temperature above 101F.   You develop vomiting or bowel movements that are bloody or black.

## 2019-05-23 NOTE — Anesthesia Postprocedure Evaluation (Signed)
Anesthesia Post Note  Patient: Diana Graham  Procedure(s) Performed: COLONOSCOPY WITH PROPOFOL (N/A ) POLYPECTOMY  Patient location during evaluation: Phase II Anesthesia Type: MAC Level of consciousness: awake and alert and patient cooperative Pain management: satisfactory to patient Vital Signs Assessment: post-procedure vital signs reviewed and stable Respiratory status: spontaneous breathing Cardiovascular status: stable Postop Assessment: no apparent nausea or vomiting Anesthetic complications: no     Last Vitals:  Vitals:   05/23/19 1111 05/23/19 1123  BP: 118/75 130/80  Pulse: 82 70  Resp: 14   Temp:  (!) 36.4 C  SpO2: 99% 99%    Last Pain:  Vitals:   05/23/19 1123  TempSrc: Oral  PainSc: 0-No pain                 Kahlee Metivier,[place]ounce

## 2019-05-24 ENCOUNTER — Telehealth: Payer: Self-pay | Admitting: Gastroenterology

## 2019-05-24 LAB — SURGICAL PATHOLOGY

## 2019-05-24 NOTE — Telephone Encounter (Signed)
Called pt and informed her of results.  She was advised to drink water to keep her urine light yellow, follow a high fiber diet, and avoid items that cause bloating.  She was informed to use Preparation H four times a day if needed.  She was informed that her next colonoscopy should be in 3 years.  Pt voiced understanding.  Offered to mail high fiber diet information but pt said that she has access to the information.

## 2019-05-24 NOTE — Telephone Encounter (Signed)
Please call pt. She had THREE simple adenomas removed from her colon.   DRINK WATER TO KEEP YOUR URINE LIGHT YELLOW. FOLLOW A HIGH FIBER DIET. AVOID ITEMS THAT CAUSE BLOATING.  USE PREPARATION H FOUR TIMES  A DAY IF NEEDED TO RELIEVE RECTAL PAIN/PRESSURE/BLEEDING. Next colonoscopy in 3 years.

## 2020-07-15 ENCOUNTER — Other Ambulatory Visit: Payer: Self-pay | Admitting: Obstetrics and Gynecology

## 2020-07-15 DIAGNOSIS — R928 Other abnormal and inconclusive findings on diagnostic imaging of breast: Secondary | ICD-10-CM

## 2020-08-02 ENCOUNTER — Other Ambulatory Visit: Payer: Self-pay

## 2020-08-02 ENCOUNTER — Ambulatory Visit
Admission: RE | Admit: 2020-08-02 | Discharge: 2020-08-02 | Disposition: A | Discharge status: Home / Self Care | Payer: BC Managed Care – PPO | Source: Ambulatory Visit | Attending: Obstetrics and Gynecology | Admitting: Obstetrics and Gynecology

## 2020-08-02 DIAGNOSIS — R928 Other abnormal and inconclusive findings on diagnostic imaging of breast: Secondary | ICD-10-CM

## 2021-11-21 IMAGING — MG MM DIGITAL DIAGNOSTIC UNILAT*L* W/ TOMO W/ CAD
4 series · 4 of 12 positions shown · non-contrast
Comparison: Previous exam(s).

CLINICAL DATA: Patient recalled from screening for left breast
mass.

EXAM:
DIGITAL DIAGNOSTIC UNILATERAL LEFT MAMMOGRAM WITH TOMOSYNTHESIS AND
CAD; ULTRASOUND LEFT BREAST LIMITED
TECHNIQUE: Left digital diagnostic mammography and breast tomosynthesis was
performed. The images were evaluated with computer-aided detection.;
Targeted ultrasound examination of the left breast was performed

[L MLO synth-2D]
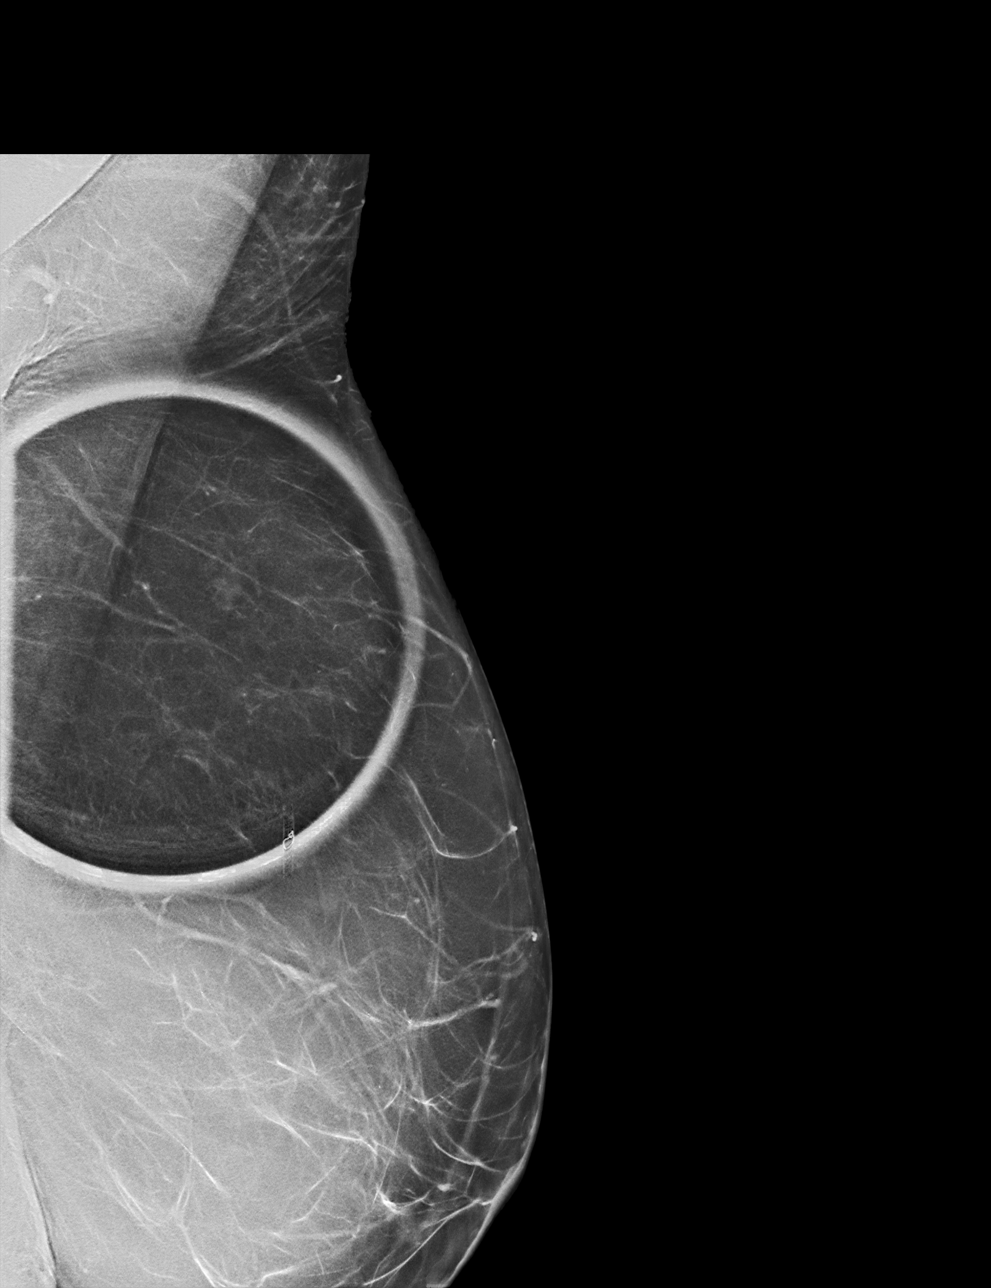

[L CC synth-2D]
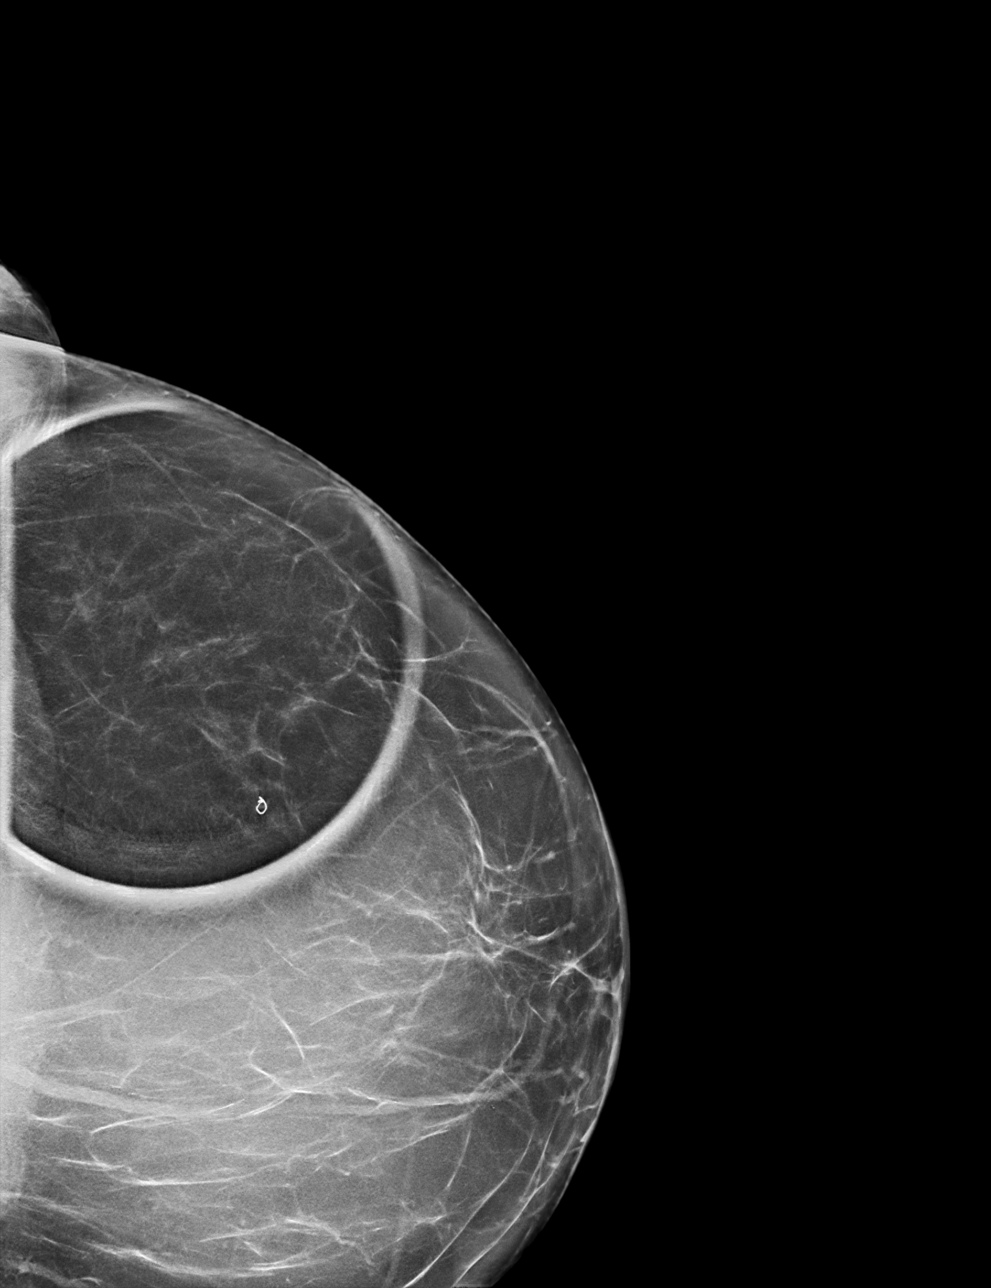

[L MLO tomo · tomo slice 33/65.0]
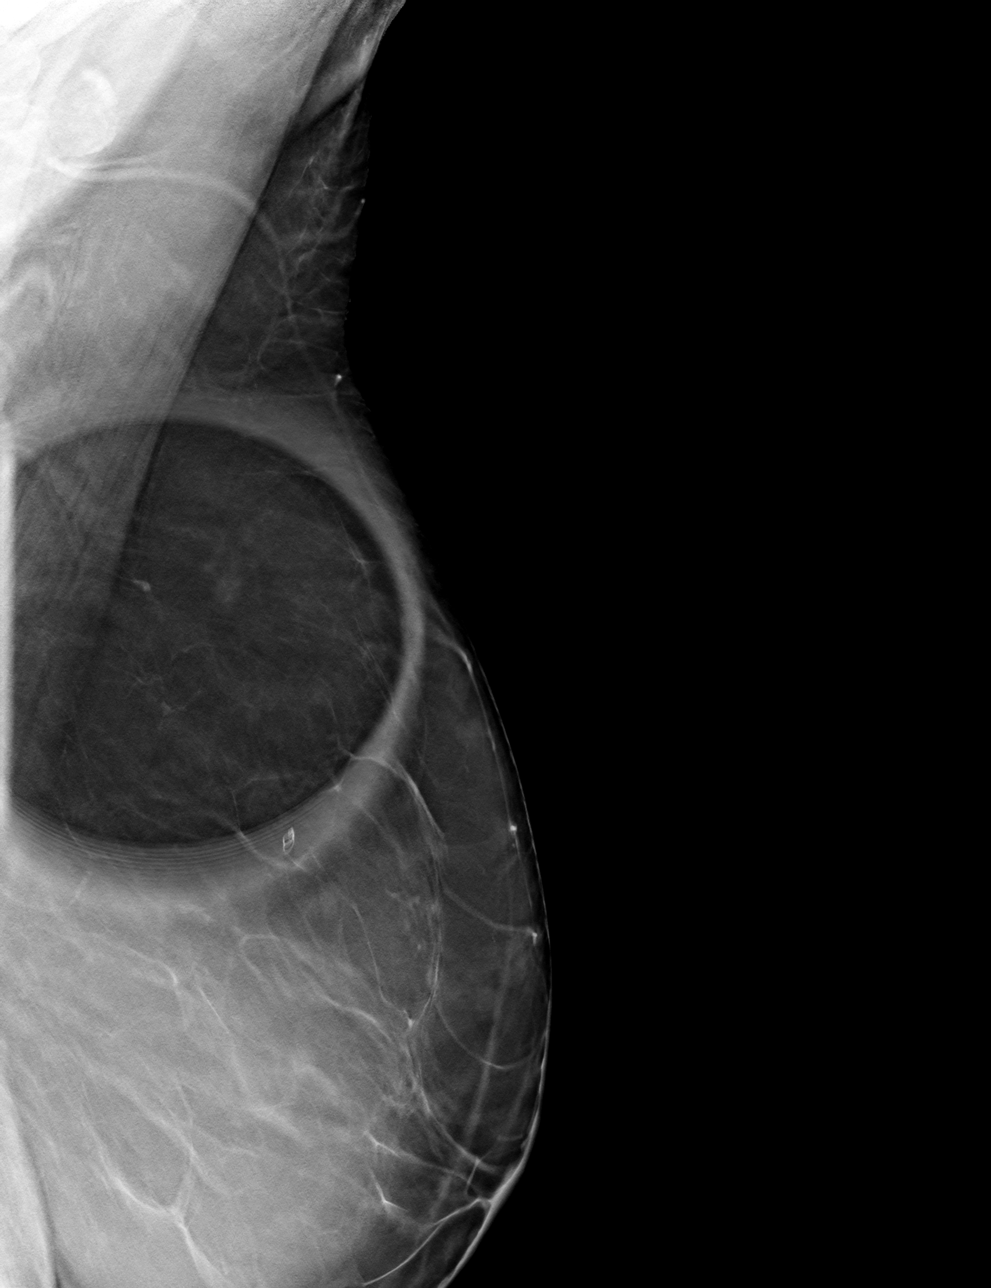

[L CC tomo · tomo slice 33/64.0]
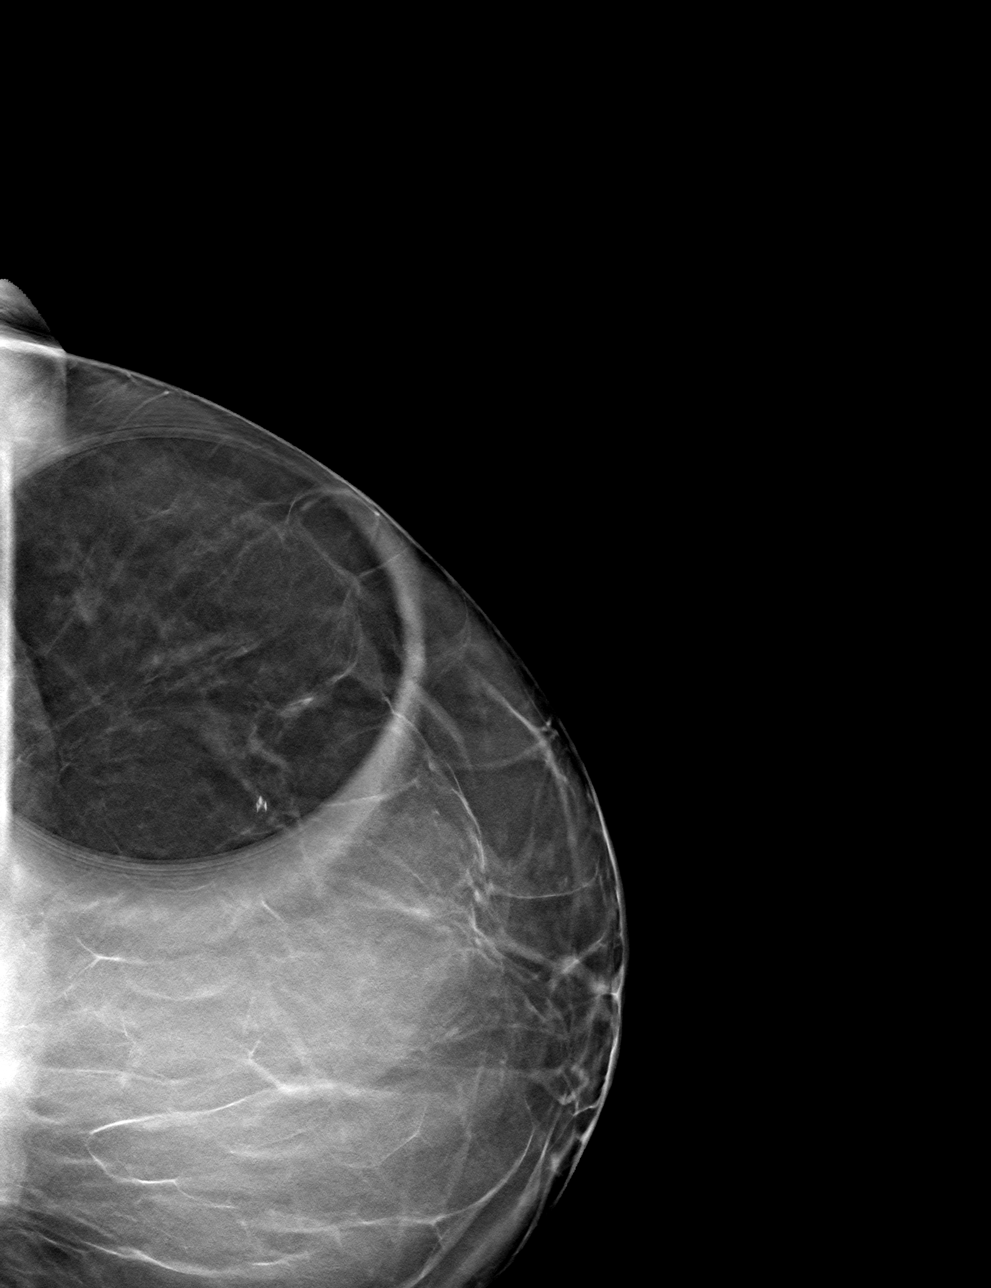

[4 of 12 positions shown; findings below may reference images not displayed]

ACR Breast Density Category b: There are scattered areas of
fibroglandular density.
FINDINGS: Persistent low-density circumscribed mass within the upper-outer
left breast on additional imaging.

Targeted ultrasound is performed, showing a left breast cluster of
cysts measuring 4 x 5 x 3 mm 2 o'clock position 9 cm from nipple.
IMPRESSION: Left breast cluster of cysts. No mammographic evidence for
malignancy.

RECOMMENDATION:
Screening mammogram in one year.(Code:E1-H-JJ1)

I have discussed the findings and recommendations with the patient.
If applicable, a reminder letter will be sent to the patient
regarding the next appointment.

BI-RADS CATEGORY  2: Benign.

## 2021-11-21 IMAGING — US US BREAST*L* LIMITED INC AXILLA
1 series · 7 of 7 positions shown · non-contrast
Comparison: Previous exam(s).

CLINICAL DATA: Patient recalled from screening for left breast
mass.

EXAM:
DIGITAL DIAGNOSTIC UNILATERAL LEFT MAMMOGRAM WITH TOMOSYNTHESIS AND
CAD; ULTRASOUND LEFT BREAST LIMITED
TECHNIQUE: Left digital diagnostic mammography and breast tomosynthesis was
performed. The images were evaluated with computer-aided detection.;
Targeted ultrasound examination of the left breast was performed

[Series 1: us breast*left* limited inc axilla · 0.05mm/px · 7 of 7 slices shown]
[im 1/7]
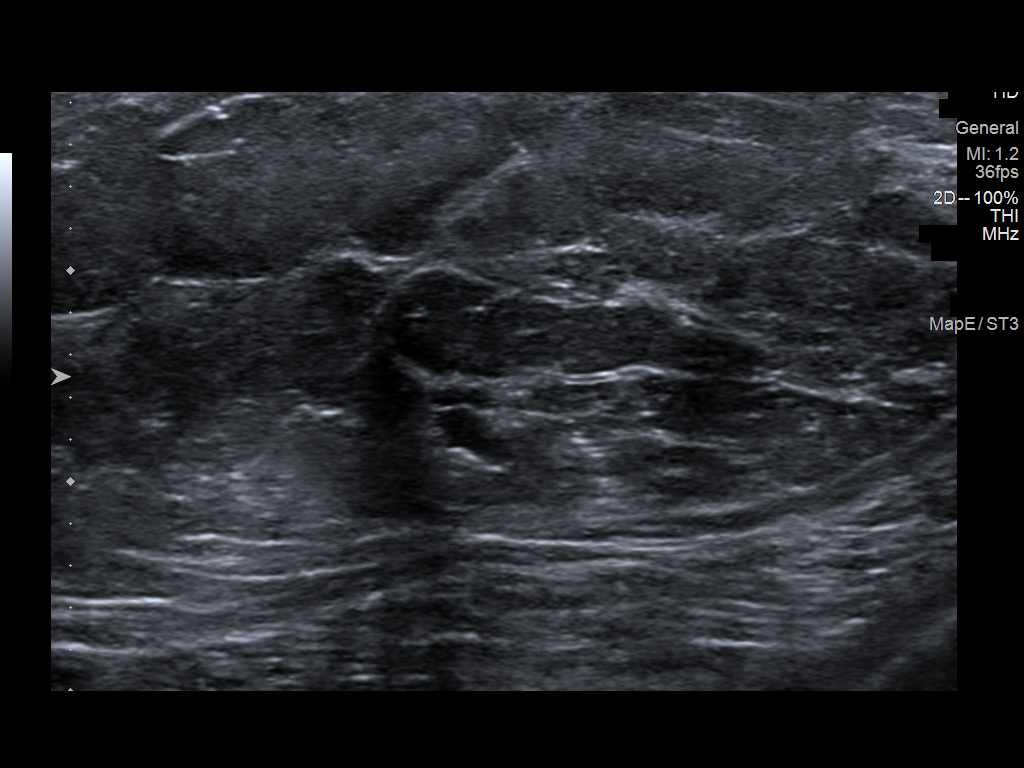
[im 2/7]
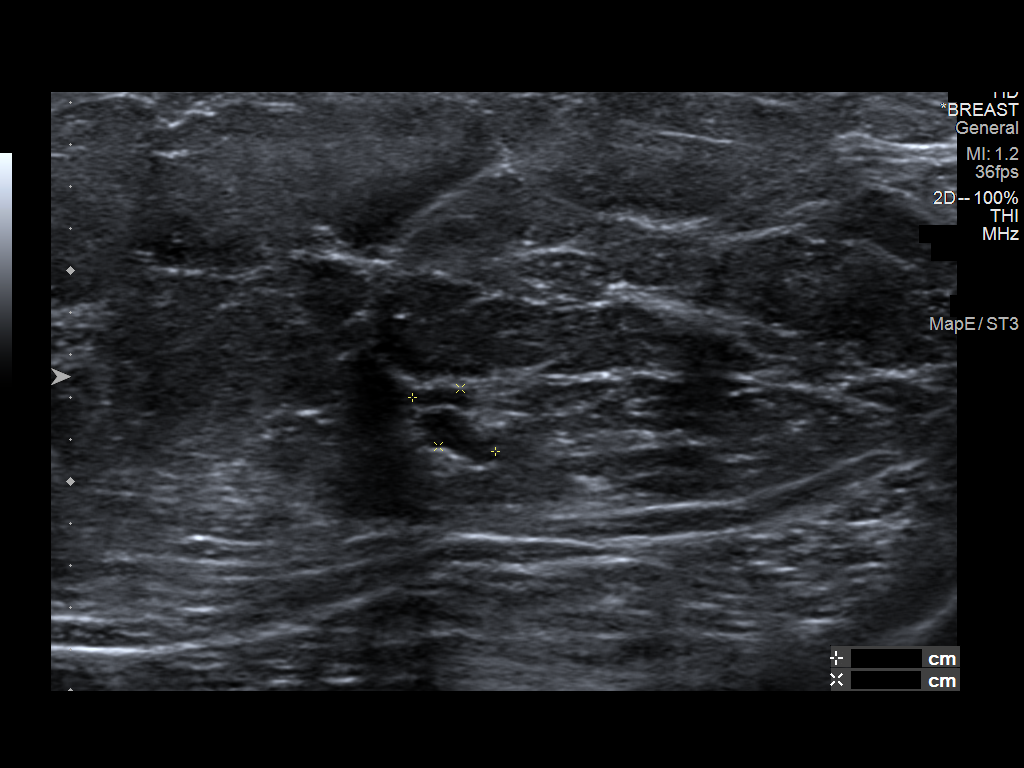
[im 3/7]
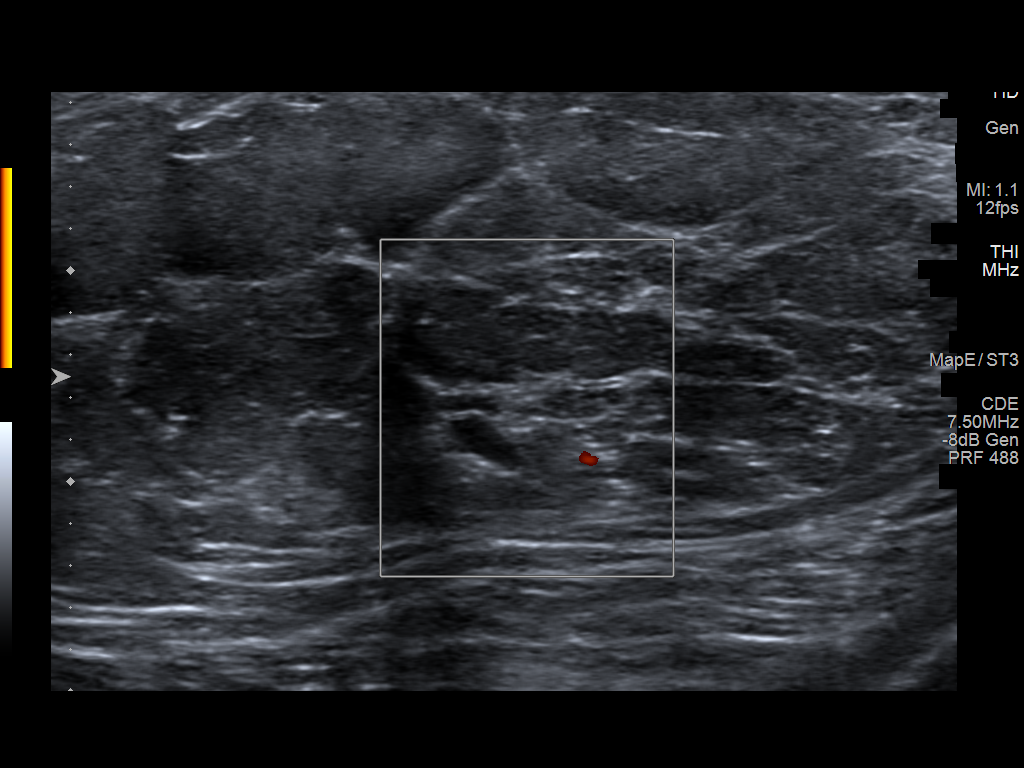
[im 4/7]
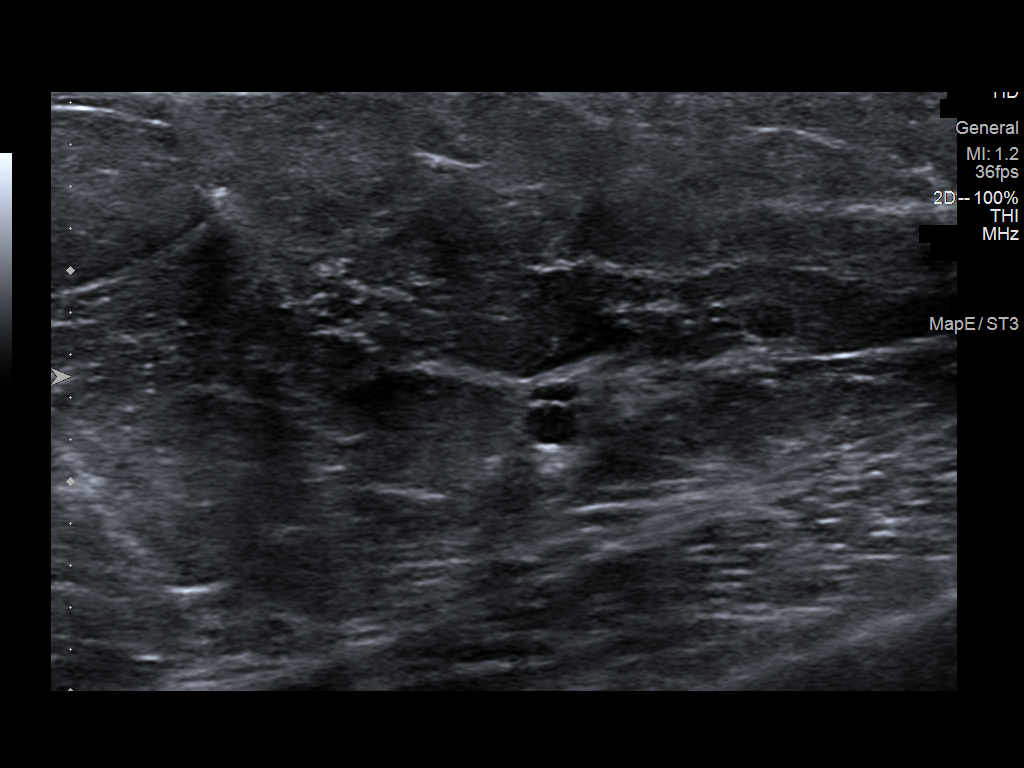
[im 5/7]
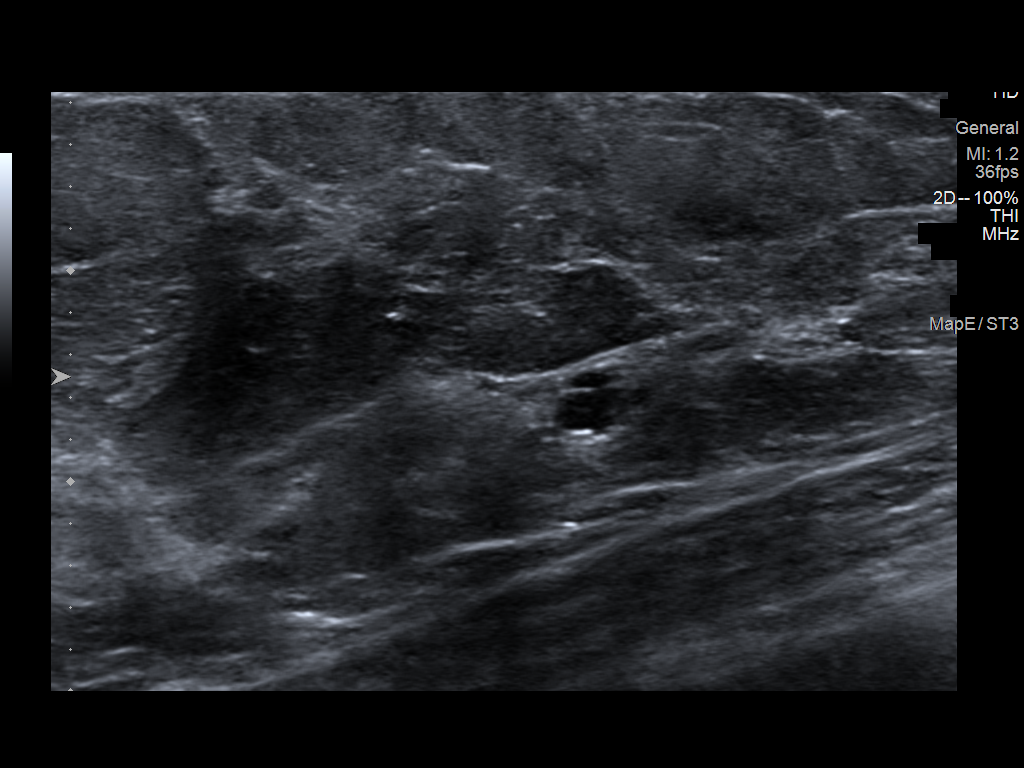
[im 6/7]
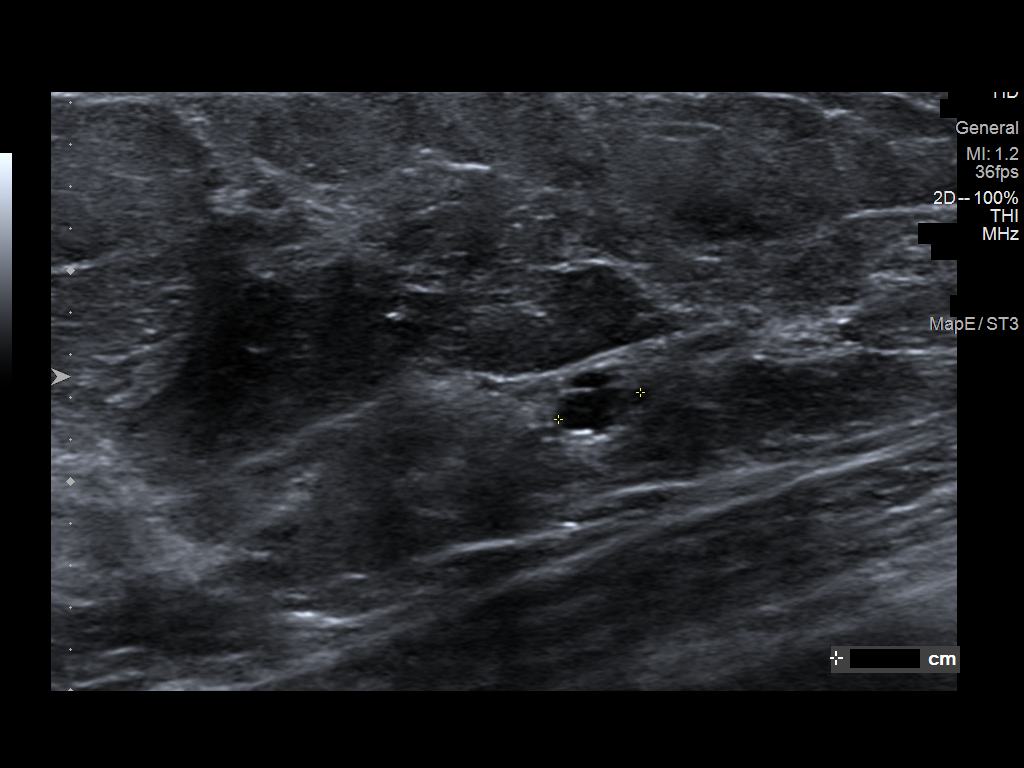
[im 7/7]
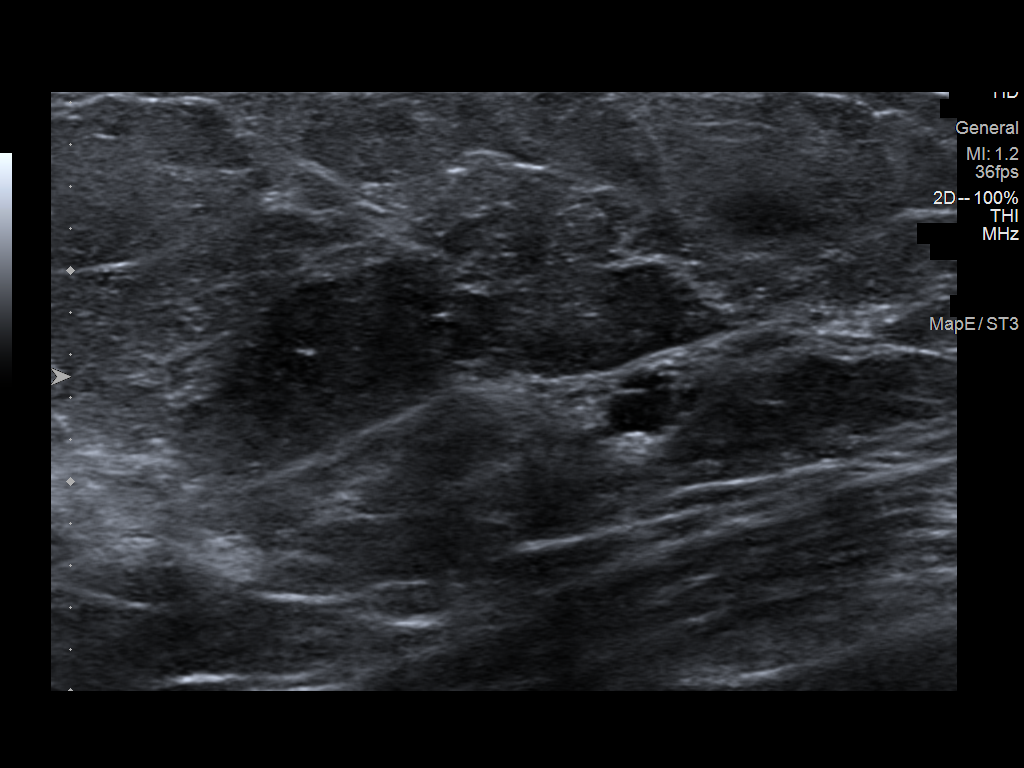

[7 of 7 positions shown; findings below may reference images not displayed]

ACR Breast Density Category b: There are scattered areas of
fibroglandular density.
FINDINGS: Persistent low-density circumscribed mass within the upper-outer
left breast on additional imaging.

Targeted ultrasound is performed, showing a left breast cluster of
cysts measuring 4 x 5 x 3 mm 2 o'clock position 9 cm from nipple.
IMPRESSION: Left breast cluster of cysts. No mammographic evidence for
malignancy.

RECOMMENDATION:
Screening mammogram in one year.(Code:E1-H-JJ1)

I have discussed the findings and recommendations with the patient.
If applicable, a reminder letter will be sent to the patient
regarding the next appointment.

BI-RADS CATEGORY  2: Benign.
